# Patient Record
Sex: Female | Born: 1970 | Race: White | Hispanic: No | Marital: Single | Smoking: Current every day smoker
Health system: Southern US, Community
[De-identification: ages and names within clinical notes are randomized; demographics above are authoritative.]

## PROBLEM LIST (undated history)

## (undated) DIAGNOSIS — F603 Borderline personality disorder: Secondary | ICD-10-CM

## (undated) DIAGNOSIS — E781 Pure hyperglyceridemia: Secondary | ICD-10-CM

## (undated) DIAGNOSIS — F419 Anxiety disorder, unspecified: Secondary | ICD-10-CM

## (undated) DIAGNOSIS — F329 Major depressive disorder, single episode, unspecified: Secondary | ICD-10-CM

## (undated) HISTORY — PX: DILATION AND CURETTAGE OF UTERUS: SHX78

## (undated) HISTORY — PX: TONSILLECTOMY: SUR1361

---

## 2002-04-07 DIAGNOSIS — X828XXA Other intentional self-harm by crashing of motor vehicle, initial encounter: Secondary | ICD-10-CM | POA: Insufficient documentation

## 2010-12-26 DIAGNOSIS — IMO0002 Reserved for concepts with insufficient information to code with codable children: Secondary | ICD-10-CM | POA: Insufficient documentation

## 2010-12-26 DIAGNOSIS — R87810 Cervical high risk human papillomavirus (HPV) DNA test positive: Secondary | ICD-10-CM | POA: Insufficient documentation

## 2012-02-26 DIAGNOSIS — N393 Stress incontinence (female) (male): Secondary | ICD-10-CM | POA: Insufficient documentation

## 2013-10-31 DIAGNOSIS — F1020 Alcohol dependence, uncomplicated: Secondary | ICD-10-CM | POA: Insufficient documentation

## 2013-10-31 DIAGNOSIS — Z91199 Patient's noncompliance with other medical treatment and regimen due to unspecified reason: Secondary | ICD-10-CM | POA: Insufficient documentation

## 2015-12-29 DIAGNOSIS — F1420 Cocaine dependence, uncomplicated: Secondary | ICD-10-CM | POA: Insufficient documentation

## 2016-05-17 DIAGNOSIS — Z221 Carrier of other intestinal infectious diseases: Secondary | ICD-10-CM | POA: Insufficient documentation

## 2016-10-02 DIAGNOSIS — F4310 Post-traumatic stress disorder, unspecified: Secondary | ICD-10-CM | POA: Insufficient documentation

## 2016-10-21 DIAGNOSIS — F603 Borderline personality disorder: Secondary | ICD-10-CM | POA: Diagnosis present

## 2017-01-01 DIAGNOSIS — I341 Nonrheumatic mitral (valve) prolapse: Secondary | ICD-10-CM | POA: Insufficient documentation

## 2017-01-01 DIAGNOSIS — M5136 Other intervertebral disc degeneration, lumbar region: Secondary | ICD-10-CM | POA: Insufficient documentation

## 2017-01-01 DIAGNOSIS — E669 Obesity, unspecified: Secondary | ICD-10-CM | POA: Insufficient documentation

## 2017-01-01 DIAGNOSIS — F17200 Nicotine dependence, unspecified, uncomplicated: Secondary | ICD-10-CM | POA: Insufficient documentation

## 2017-02-04 DIAGNOSIS — M2550 Pain in unspecified joint: Secondary | ICD-10-CM | POA: Insufficient documentation

## 2017-02-04 DIAGNOSIS — M353 Polymyalgia rheumatica: Secondary | ICD-10-CM | POA: Insufficient documentation

## 2017-04-01 DIAGNOSIS — J310 Chronic rhinitis: Secondary | ICD-10-CM | POA: Insufficient documentation

## 2017-04-01 DIAGNOSIS — R053 Chronic cough: Secondary | ICD-10-CM | POA: Insufficient documentation

## 2017-07-12 DIAGNOSIS — F1220 Cannabis dependence, uncomplicated: Secondary | ICD-10-CM | POA: Insufficient documentation

## 2018-04-24 DIAGNOSIS — E559 Vitamin D deficiency, unspecified: Secondary | ICD-10-CM | POA: Insufficient documentation

## 2019-03-08 DIAGNOSIS — R45851 Suicidal ideations: Secondary | ICD-10-CM | POA: Insufficient documentation

## 2019-03-08 DIAGNOSIS — D735 Infarction of spleen: Secondary | ICD-10-CM | POA: Insufficient documentation

## 2019-03-09 DIAGNOSIS — F341 Dysthymic disorder: Secondary | ICD-10-CM | POA: Insufficient documentation

## 2019-07-06 DIAGNOSIS — R768 Other specified abnormal immunological findings in serum: Secondary | ICD-10-CM | POA: Insufficient documentation

## 2020-07-13 ENCOUNTER — Emergency Department (HOSPITAL_COMMUNITY)
Admission: EM | Admit: 2020-07-13 | Discharge: 2020-07-14 | Disposition: A | Discharge status: Home / Self Care | Payer: Medicare Other | Attending: Emergency Medicine | Admitting: Emergency Medicine

## 2020-07-13 ENCOUNTER — Encounter (HOSPITAL_COMMUNITY): Payer: Self-pay

## 2020-07-13 DIAGNOSIS — R45851 Suicidal ideations: Secondary | ICD-10-CM | POA: Diagnosis not present

## 2020-07-13 DIAGNOSIS — H9190 Unspecified hearing loss, unspecified ear: Secondary | ICD-10-CM

## 2020-07-13 DIAGNOSIS — F17210 Nicotine dependence, cigarettes, uncomplicated: Secondary | ICD-10-CM | POA: Diagnosis not present

## 2020-07-13 DIAGNOSIS — Z20822 Contact with and (suspected) exposure to covid-19: Secondary | ICD-10-CM | POA: Diagnosis not present

## 2020-07-13 DIAGNOSIS — F332 Major depressive disorder, recurrent severe without psychotic features: Secondary | ICD-10-CM | POA: Insufficient documentation

## 2020-07-13 DIAGNOSIS — F603 Borderline personality disorder: Secondary | ICD-10-CM | POA: Insufficient documentation

## 2020-07-13 HISTORY — DX: Major depressive disorder, single episode, unspecified: F32.9

## 2020-07-13 HISTORY — DX: Anxiety disorder, unspecified: F41.9

## 2020-07-13 HISTORY — DX: Borderline personality disorder: F60.3

## 2020-07-13 HISTORY — DX: Unspecified hearing loss, unspecified ear: H91.90

## 2020-07-13 LAB — COMPREHENSIVE METABOLIC PANEL
ALT: 14 U/L (ref 0–44)
AST: 16 U/L (ref 15–41)
Albumin: 4 g/dL (ref 3.5–5.0)
Alkaline Phosphatase: 97 U/L (ref 38–126)
Anion gap: 7 (ref 5–15)
BUN: 12 mg/dL (ref 6–20)
CO2: 25 mmol/L (ref 22–32)
Calcium: 9.4 mg/dL (ref 8.9–10.3)
Chloride: 106 mmol/L (ref 98–111)
Creatinine, Ser: 0.92 mg/dL (ref 0.44–1.00)
GFR, Estimated: >60 mL/min (ref >60)
Glucose, Bld: 110 mg/dL — ABNORMAL HIGH (ref 70–99)
Potassium: 4.4 mmol/L (ref 3.5–5.1)
Sodium: 138 mmol/L (ref 135–145)
Total Bilirubin: 0.2 mg/dL — ABNORMAL LOW (ref 0.3–1.2)
Total Protein: 6.8 g/dL (ref 6.5–8.1)

## 2020-07-13 LAB — CBC WITH DIFFERENTIAL/PLATELET
Abs Immature Granulocytes: 0.03 10*3/uL (ref 0.00–0.07)
Basophils Absolute: 0 10*3/uL (ref 0.0–0.1)
Basophils Relative: 1 %
Eosinophils Absolute: 0.1 10*3/uL (ref 0.0–0.5)
Eosinophils Relative: 1 %
HCT: 34.9 % — ABNORMAL LOW (ref 36.0–46.0)
Hemoglobin: 11.2 g/dL — ABNORMAL LOW (ref 12.0–15.0)
Immature Granulocytes: 0 %
Lymphocytes Relative: 25 %
Lymphs Abs: 1.7 10*3/uL (ref 0.7–4.0)
MCH: 29.8 pg (ref 26.0–34.0)
MCHC: 32.1 g/dL (ref 30.0–36.0)
MCV: 92.8 fL (ref 80.0–100.0)
Monocytes Absolute: 0.4 10*3/uL (ref 0.1–1.0)
Monocytes Relative: 6 %
Neutro Abs: 4.5 10*3/uL (ref 1.7–7.7)
Neutrophils Relative %: 67 %
Platelets: 431 10*3/uL — ABNORMAL HIGH (ref 150–400)
RBC: 3.76 MIL/uL — ABNORMAL LOW (ref 3.87–5.11)
RDW: 13.2 % (ref 11.5–15.5)
WBC: 6.7 10*3/uL (ref 4.0–10.5)
nRBC: 0 % (ref 0.0–0.2)

## 2020-07-13 LAB — RESP PANEL BY RT-PCR (FLU A&B, COVID) ARPGX2
Influenza A by PCR: NEGATIVE
Influenza B by PCR: NEGATIVE
SARS Coronavirus 2 by RT PCR: NEGATIVE

## 2020-07-13 LAB — RAPID URINE DRUG SCREEN, HOSP PERFORMED
Amphetamines: NOT DETECTED
Barbiturates: NOT DETECTED
Benzodiazepines: NOT DETECTED
Cocaine: NOT DETECTED
Opiates: NOT DETECTED
Tetrahydrocannabinol: NOT DETECTED

## 2020-07-13 LAB — ETHANOL: Alcohol, Ethyl (B): <10 mg/dL (ref <10)

## 2020-07-13 NOTE — ED Notes (Signed)
Ambulated to BR and back to bed with RN.

## 2020-07-13 NOTE — ED Provider Notes (Signed)
[facility] EMERGENCY DEPARTMENT Provider Note   CSN: [id number] Arrival date & time: 07/13/20  1031     History No chief complaint on file.   Anna [name] is a 50 y.o. female.  HPI Patient with history of major depression.  Presents from [facility] being suicidal.  Reportedly has a plan to hang herself.  States she has been struggling with this for years.  States she stopped taking her Trintellix because it was not working.  Stopped a couple weeks ago.  States she has been dealing with some medical issues that also has been making her feel worse.  Has a history of vaginal bleeding postmenopausally.  Had recent endometrial biopsy that did not show cancer.  States she been feeling bad about that.  Denies substance abuse.    Past Medical History:  Diagnosis Date  . Anxiety   . Borderline personality disorder in adult [facility])   . Hearing deficit 07/13/2020  . MDD (major depressive disorder)     There are no problems to display for this patient.   History reviewed. No pertinent surgical history.   OB History   No obstetric history on file.     No family history on file.  Social History   Tobacco Use  . Smoking status: Current Some Day Smoker    Packs/day: 0.20    Years: 40.00    Pack years: 8.00    Types: Cigarettes  . Smokeless tobacco: Never Used  Vaping Use  . Vaping Use: Every day  . Start date: 04/07/2020  . Substances: Nicotine, Flavoring  Substance Use Topics  . Alcohol use: Not Currently  . Drug use: Not Currently    Types: Cocaine, Marijuana    Home Medications Prior to Admission medications   Not on File    Allergies    Patient has no allergy information on record.  Review of Systems   Review of Systems  Constitutional: Negative for appetite change.  HENT: Negative for congestion.   Respiratory: Negative for shortness of breath.   Cardiovascular: Negative for chest pain.  Gastrointestinal: Negative for abdominal pain.   Genitourinary: Positive for vaginal bleeding.  Musculoskeletal: Negative for back pain.  Skin: Negative for rash.  Neurological: Negative for weakness.  Psychiatric/Behavioral: Positive for suicidal ideas. Negative for decreased concentration.    Physical Exam Updated Vital Signs BP 131/83 (BP Location: Left Arm)   Pulse 78   Temp 98.2 F (36.8 C) (Oral)   Resp 16   SpO2 100%   Physical Exam Vitals and nursing note reviewed.  HENT:     Head: Atraumatic.     Mouth/Throat:     Pharynx: Oropharynx is clear.  Cardiovascular:     Rate and Rhythm: Regular rhythm.  Pulmonary:     Breath sounds: No wheezing or rhonchi.  Abdominal:     Tenderness: There is no abdominal tenderness.  Musculoskeletal:        General: No tenderness.     Cervical back: Neck supple.  Skin:    General: Skin is warm.     Capillary Refill: Capillary refill takes less than 2 seconds.  Neurological:     Mental Status: She is alert and oriented to person, place, and time.  Psychiatric:     Comments: Somewhat depressed affect     ED Results / Procedures / Treatments   Labs (all labs ordered are listed, but only abnormal results are displayed) Labs Reviewed  COMPREHENSIVE METABOLIC PANEL - Abnormal; Notable for the following  components:      Result Value   Glucose, Bld 110 (*)    Total Bilirubin 0.2 (*)    All other components within normal limits  CBC WITH DIFFERENTIAL/PLATELET - Abnormal; Notable for the following components:   RBC 3.76 (*)    Hemoglobin 11.2 (*)    HCT 34.9 (*)    Platelets 431 (*)    All other components within normal limits  RESP PANEL BY RT-PCR (FLU A&B, COVID) ARPGX2  ETHANOL  RAPID URINE DRUG SCREEN, HOSP PERFORMED    EKG None  Radiology No results found.  Procedures Procedures   Medications Ordered in ED Medications - No data to display  ED Course  I have reviewed the triage vital signs and the nursing notes.  Pertinent labs & imaging results that were  available during my care of the patient were reviewed by me and considered in my medical decision making (see chart for details).    MDM Rules/Calculators/A&P                          Patient with suicidal ideations with plan.  Plan to hang herself.  States she is worried that she was going to do it.  Has previous suicide attempt.  Patient is medically cleared.  Will have patient seen by TTS. Final Clinical Impression(s) / ED Diagnoses Final diagnoses:  Suicidal ideations    Rx / DC Orders ED Discharge Orders    None       Benjiman Core, MD 07/13/20 1416

## 2020-07-13 NOTE — ED Notes (Signed)
Spoke with Charge nurse moved patient close to nurse station in visibility for staff to monitor.

## 2020-07-13 NOTE — ED Notes (Signed)
Patient resting on stretcher in direct line of vision to this RN. Patient with no distress noted. Staffing called for sitter when available. Room and environment cleared as much as possible of anything that patient could use to harm herself. Patient verbalized that she wants to hang herself.

## 2020-07-13 NOTE — BH Assessment (Addendum)
Comprehensive Clinical Assessment (CCA) Note  07/13/2020 Alan Riles [id number]   Disposition: Berneice Heinrich, FNP recommends in patient treatment. Patient is a high suicide risk and a 1:1 sitter is recommended.  The patient demonstrates the following risk factors for suicide: Chronic risk factors for suicide include: psychiatric disorder of depression, previous suicide attempts 3, chronic pain and completed suicide in a family member. Acute risk factors for suicide include: family or marital conflict and social withdrawal/isolation. Protective factors for this patient include: none. Considering these factors, the overall suicide risk at this point appears to be high. Patient is not appropriate for outpatient follow up.  Flowsheet Row ED from 07/13/2020 in [facility] EMERGENCY DEPARTMENT  C-SSRS RISK CATEGORY High Risk     Patient is a 50 year old female presenting voluntarily to [facility] ED reporting suicidal ideation with a plan to hang herself. Patient reports she went to [facility] in [place] and they "dropped her off" at the ED because they felt she needed a behavioral health hospital. Patient reports she began taking Trintellix 1 year ago but did not feel any symptom relief so discontinued it 4 days ago. Since then patient has become increasingly depressed, has poor motivation, and struggles to complete ADLs. 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 She is currently living in a recovery home but states the owner of the property is changing it to transitional housing and she may need to relocate. She denies HI/AVH. Patient reports she does not have any natural supports as her 6 children, 2 siblings, and mother are all estranged. She states this is a major contributing factor to her suicidal ideation.   Chief Complaint: Suicidal  Visit Diagnosis: F33.2 MDD, recurrent, severe    F60.3 BPD (per history)     CCA  Biopsychosocial Intake/Chief Complaint:  NA  Current Symptoms/Problems: NA   Patient Reported Schizophrenia/Schizoaffective Diagnosis in Past: No   Strengths: NA  Preferences: NA  Abilities: NA   Type of Services Patient Feels are Needed: NA   Initial Clinical Notes/Concerns: NA   Mental Health Symptoms Depression:  Change in energy/activity; Difficulty Concentrating; Fatigue; Hopelessness; Increase/decrease in appetite; Irritability; Sleep (too much or little); Tearfulness; Weight gain/loss; Worthlessness   Duration of Depressive symptoms: Greater than two weeks   Mania:  None   Anxiety:   None   Psychosis:  None   Duration of Psychotic symptoms: No data recorded  Trauma:  None   Obsessions:  None   Compulsions:  None   Inattention:  None   Hyperactivity/Impulsivity:  N/A   Oppositional/Defiant Behaviors:  N/A   Emotional Irregularity:  Chronic feelings of emptiness; Intense/unstable relationships; Recurrent suicidal behaviors/gestures/threats   Other Mood/Personality Symptoms:  No data recorded   Mental Status Exam Appearance and self-care  Stature:  Average   Weight:  Overweight   Clothing:  Neat/clean   Grooming:  Normal   Cosmetic use:  None   Posture/gait:  Slumped   Motor activity:  Not Remarkable   Sensorium  Attention:  Normal   Concentration:  Normal   Orientation:  X5   Recall/memory:  Normal   Affect and Mood  Affect:  Tearful; Depressed   Mood:  Depressed   Relating  Eye contact:  Normal   Facial expression:  Depressed   Attitude toward examiner:  Cooperative   Thought and Language  Speech flow: Clear and Coherent   Thought content:  Appropriate to Mood and Circumstances  Preoccupation:  None   Hallucinations:  None   Organization:  No data recorded  [employer] of Knowledge:  Good   Intelligence:  Average   Abstraction:  Normal   Judgement:  Impaired   Reality Testing:  Realistic    Insight:  Lacking   Decision Making:  Paralyzed   Social Functioning  Social Maturity:  Impulsive   Social Judgement:  Normal   Stress  Stressors:  Housing; Family conflict; Relationship   Coping Ability:  Exhausted   Skill Deficits:  Activities of daily living; Decision making; Interpersonal; Self-care   Supports:  Support needed     Religion: Religion/Spirituality Are You A Religious Person?: No  Leisure/Recreation: Leisure / Recreation Do You Have Hobbies?: No  Exercise/Diet: Exercise/Diet Do You Exercise?: No Have You Gained or Lost A Significant Amount of Weight in the Past Six Months?: No Do You Follow a Special Diet?: No Do You Have Any Trouble Sleeping?: Yes Explanation of Sleeping Difficulties: reports lays in bed all day and night but cannot sleep   CCA Employment/Education Employment/Work Situation: Employment / Work Situation Employment situation: On disability Why is patient on disability: NA How long has patient been on disability: NA Patient's job has been impacted by current illness: No What is the longest time patient has a held a job?: [employer] Where was the patient employed at that time?: [employer] Has patient ever been in the [employer]?: No  Education: Education Is Patient Currently Attending School?: No Last Grade Completed: 12 Name of High School: NA Did Garment/textile technologist From [employer]?: Yes Did Theme park manager?: No Did You Attend Graduate School?: No Did You Have An Individualized Education Program (IIEP): No Did You Have Any Difficulty At School?: No Patient's Education Has Been Impacted by Current Illness: No   CCA Family/Childhood History Family and Relationship History: Family history Marital status: Single Are you sexually active?: Yes What is your sexual orientation?: heterosexual Has your sexual activity been affected by drugs, alcohol, medication, or emotional stress?: NA Does patient have children?: Yes How many children?:  6 How is patient's relationship with their children?: all estranged except for 1  Childhood History:  Childhood History By whom was/is the patient raised?: Both parents Additional childhood history information: moved to [place] from [facility] at 74 Description of patient's relationship with caregiver when they were a child: close, supportive Patient's description of current relationship with people who raised him/her: relationship with mother is strained due a relationship she was in How were you disciplined when you got in trouble as a child/adolescent?: NA Does patient have siblings?: Yes Number of Siblings: 2 Description of patient's current relationship with siblings: sister is estranged, brother in [place], no contact Did patient suffer any verbal/emotional/physical/sexual abuse as a child?: No Did patient suffer from severe childhood neglect?: No Has patient ever been sexually abused/assaulted/raped as an adolescent or adult?: No Was the patient ever a victim of a crime or a disaster?: No Witnessed domestic violence?: No Has patient been affected by domestic violence as an adult?: No  Child/Adolescent Assessment:     CCA Substance Use Alcohol/Drug Use: Alcohol / Drug Use Pain Medications: see MAR Prescriptions: see MAR Over the Counter: see MAR History of alcohol / drug use?: Yes Longest period of sobriety (when/how long): Patient clean and sober since December 2021                         ASAM's:  Six Dimensions  of Multidimensional Assessment  Dimension 1:  Acute Intoxication and/or Withdrawal Potential:      Dimension 2:  Biomedical Conditions and Complications:      Dimension 3:  Emotional, Behavioral, or Cognitive Conditions and Complications:     Dimension 4:  Readiness to Change:     Dimension 5:  Relapse, Continued use, or Continued Problem Potential:     Dimension 6:  Recovery/Living Environment:     ASAM Severity Score:    ASAM Recommended Level of  Treatment:     Substance use Disorder (SUD)    Recommendations for Services/Supports/Treatments:    DSM5 Diagnoses: There are no problems to display for this patient.   Patient Centered Plan: Patient is on the following Treatment Plan(s):   Referrals to Alternative Service(s): Referred to Alternative Service(s):   Place:   Date:   Time:    Referred to Alternative Service(s):   Place:   Date:   Time:    Referred to Alternative Service(s):   Place:   Date:   Time:    Referred to Alternative Service(s):   Place:   Date:   Time:     Celedonio Miyamoto, LCSW

## 2020-07-13 NOTE — ED Triage Notes (Signed)
Pt sent over to the Ed by [facility] for [facility] thoughts with a plan hx of same

## 2020-07-14 NOTE — Progress Notes (Signed)
Per Berneice Heinrich, FNP, patient meets criteria for inpatient treatment. There are no appropriate beds at [facility] today. CSW faxed referrals to the following facilities for review:  [facility] [place] [facility]  TTS will continue to seek bed placement.  Crissie Reese, MSW, LCSW-A, LCAS-A Phone: [phone] Disposition/TOC

## 2020-07-14 NOTE — Progress Notes (Signed)
Per Lamar Laundry, RN, pt has been accepted to [facility], room 822 A. Accepting provider is Dr. Angela Burke. Patient can arrive anytime. Number for report is [phone]. Patient's nurse, Jamey Ripa, RN,  has been notified.   Crissie Reese, MSW, LCSW-A Phone: [phone] Disposition/TOC

## 2020-10-22 DIAGNOSIS — F17200 Nicotine dependence, unspecified, uncomplicated: Secondary | ICD-10-CM | POA: Diagnosis present

## 2021-01-29 DIAGNOSIS — F411 Generalized anxiety disorder: Secondary | ICD-10-CM | POA: Diagnosis present

## 2021-02-08 ENCOUNTER — Other Ambulatory Visit: Payer: Self-pay | Admitting: Psychiatry

## 2021-02-08 ENCOUNTER — Emergency Department (HOSPITAL_COMMUNITY): Payer: Medicare Other

## 2021-02-08 ENCOUNTER — Inpatient Hospital Stay (HOSPITAL_COMMUNITY)
Admission: RE | Admit: 2021-02-08 | Discharge: 2021-02-26 | DRG: 885 | Disposition: A | Discharge status: Other Acute Inpatient Hospital | Payer: Medicare Other | Source: Intra-hospital | Attending: Emergency Medicine | Admitting: Emergency Medicine

## 2021-02-08 ENCOUNTER — Emergency Department (HOSPITAL_COMMUNITY)
Admission: EM | Admit: 2021-02-08 | Discharge: 2021-02-08 | Disposition: A | Discharge status: Other Acute Inpatient Hospital | Payer: Medicare Other | Source: Home / Self Care | Attending: Emergency Medicine | Admitting: Emergency Medicine

## 2021-02-08 DIAGNOSIS — F17210 Nicotine dependence, cigarettes, uncomplicated: Secondary | ICD-10-CM | POA: Diagnosis present

## 2021-02-08 DIAGNOSIS — F1020 Alcohol dependence, uncomplicated: Secondary | ICD-10-CM | POA: Diagnosis present

## 2021-02-08 DIAGNOSIS — F603 Borderline personality disorder: Secondary | ICD-10-CM | POA: Diagnosis present

## 2021-02-08 DIAGNOSIS — Z9151 Personal history of suicidal behavior: Secondary | ICD-10-CM

## 2021-02-08 DIAGNOSIS — Z79899 Other long term (current) drug therapy: Secondary | ICD-10-CM

## 2021-02-08 DIAGNOSIS — Z888 Allergy status to other drugs, medicaments and biological substances status: Secondary | ICD-10-CM | POA: Diagnosis not present

## 2021-02-08 DIAGNOSIS — F411 Generalized anxiety disorder: Secondary | ICD-10-CM | POA: Diagnosis present

## 2021-02-08 DIAGNOSIS — F4310 Post-traumatic stress disorder, unspecified: Secondary | ICD-10-CM | POA: Diagnosis present

## 2021-02-08 DIAGNOSIS — Z9114 Patient's other noncompliance with medication regimen: Secondary | ICD-10-CM | POA: Diagnosis not present

## 2021-02-08 DIAGNOSIS — R451 Restlessness and agitation: Secondary | ICD-10-CM | POA: Diagnosis present

## 2021-02-08 DIAGNOSIS — F332 Major depressive disorder, recurrent severe without psychotic features: Secondary | ICD-10-CM | POA: Diagnosis not present

## 2021-02-08 DIAGNOSIS — R4182 Altered mental status, unspecified: Secondary | ICD-10-CM | POA: Insufficient documentation

## 2021-02-08 DIAGNOSIS — Z20822 Contact with and (suspected) exposure to covid-19: Secondary | ICD-10-CM | POA: Diagnosis present

## 2021-02-08 DIAGNOSIS — R000 Tachycardia, unspecified: Secondary | ICD-10-CM | POA: Insufficient documentation

## 2021-02-08 DIAGNOSIS — R45851 Suicidal ideations: Secondary | ICD-10-CM | POA: Diagnosis present

## 2021-02-08 DIAGNOSIS — Z9152 Personal history of nonsuicidal self-harm: Secondary | ICD-10-CM

## 2021-02-08 DIAGNOSIS — F419 Anxiety disorder, unspecified: Secondary | ICD-10-CM | POA: Insufficient documentation

## 2021-02-08 DIAGNOSIS — Z5181 Encounter for therapeutic drug level monitoring: Secondary | ICD-10-CM | POA: Diagnosis not present

## 2021-02-08 DIAGNOSIS — F17200 Nicotine dependence, unspecified, uncomplicated: Secondary | ICD-10-CM | POA: Diagnosis present

## 2021-02-08 DIAGNOSIS — F489 Nonpsychotic mental disorder, unspecified: Secondary | ICD-10-CM

## 2021-02-08 DIAGNOSIS — Z5902 Unsheltered homelessness: Secondary | ICD-10-CM

## 2021-02-08 DIAGNOSIS — R079 Chest pain, unspecified: Secondary | ICD-10-CM | POA: Diagnosis present

## 2021-02-08 DIAGNOSIS — G4700 Insomnia, unspecified: Secondary | ICD-10-CM | POA: Diagnosis present

## 2021-02-08 DIAGNOSIS — F333 Major depressive disorder, recurrent, severe with psychotic symptoms: Principal | ICD-10-CM | POA: Diagnosis present

## 2021-02-08 DIAGNOSIS — Y900 Blood alcohol level of less than 20 mg/100 ml: Secondary | ICD-10-CM | POA: Insufficient documentation

## 2021-02-08 LAB — CBC WITH DIFFERENTIAL/PLATELET
Abs Immature Granulocytes: 0.03 10*3/uL (ref 0.00–0.07)
Basophils Absolute: 0 10*3/uL (ref 0.0–0.1)
Basophils Relative: 0 %
Eosinophils Absolute: 0 10*3/uL (ref 0.0–0.5)
Eosinophils Relative: 0 %
HCT: 46.1 % — ABNORMAL HIGH (ref 36.0–46.0)
Hemoglobin: 15.2 g/dL — ABNORMAL HIGH (ref 12.0–15.0)
Immature Granulocytes: 0 %
Lymphocytes Relative: 13 %
Lymphs Abs: 1.4 10*3/uL (ref 0.7–4.0)
MCH: 27.9 pg (ref 26.0–34.0)
MCHC: 33 g/dL (ref 30.0–36.0)
MCV: 84.7 fL (ref 80.0–100.0)
Monocytes Absolute: 0.8 10*3/uL (ref 0.1–1.0)
Monocytes Relative: 7 %
Neutro Abs: 8.4 10*3/uL — ABNORMAL HIGH (ref 1.7–7.7)
Neutrophils Relative %: 80 %
Platelets: 321 10*3/uL (ref 150–400)
RBC: 5.44 MIL/uL — ABNORMAL HIGH (ref 3.87–5.11)
RDW: 14.4 % (ref 11.5–15.5)
WBC: 10.7 10*3/uL — ABNORMAL HIGH (ref 4.0–10.5)
nRBC: 0 % (ref 0.0–0.2)

## 2021-02-08 LAB — RAPID URINE DRUG SCREEN, HOSP PERFORMED
Amphetamines: NOT DETECTED
Barbiturates: NOT DETECTED
Benzodiazepines: POSITIVE — AB
Cocaine: NOT DETECTED
Opiates: NOT DETECTED
Tetrahydrocannabinol: NOT DETECTED

## 2021-02-08 LAB — COMPREHENSIVE METABOLIC PANEL
ALT: 17 U/L (ref 0–44)
AST: 22 U/L (ref 15–41)
Albumin: 4.5 g/dL (ref 3.5–5.0)
Alkaline Phosphatase: 130 U/L — ABNORMAL HIGH (ref 38–126)
Anion gap: 11 (ref 5–15)
BUN: 14 mg/dL (ref 6–20)
CO2: 22 mmol/L (ref 22–32)
Calcium: 9.5 mg/dL (ref 8.9–10.3)
Chloride: 104 mmol/L (ref 98–111)
Creatinine, Ser: 0.63 mg/dL (ref 0.44–1.00)
GFR, Estimated: >60 mL/min (ref >60)
Glucose, Bld: 114 mg/dL — ABNORMAL HIGH (ref 70–99)
Potassium: 3.7 mmol/L (ref 3.5–5.1)
Sodium: 137 mmol/L (ref 135–145)
Total Bilirubin: 0.9 mg/dL (ref 0.3–1.2)
Total Protein: 7.6 g/dL (ref 6.5–8.1)

## 2021-02-08 LAB — LIPID PANEL
Cholesterol: 259 mg/dL — ABNORMAL HIGH (ref 0–200)
HDL: 50 mg/dL (ref >40)
LDL Cholesterol: 187 mg/dL — ABNORMAL HIGH (ref 0–99)
Total CHOL/HDL Ratio: 5.2 RATIO
Triglycerides: 110 mg/dL (ref <150)
VLDL: 22 mg/dL (ref 0–40)

## 2021-02-08 LAB — URINALYSIS, ROUTINE W REFLEX MICROSCOPIC
Bacteria, UA: NONE SEEN
Bilirubin Urine: NEGATIVE
Glucose, UA: NEGATIVE mg/dL
Ketones, ur: 80 mg/dL — AB
Leukocytes,Ua: NEGATIVE
Nitrite: NEGATIVE
Protein, ur: 30 mg/dL — AB
Specific Gravity, Urine: 1.028 (ref 1.005–1.030)
pH: 5 (ref 5.0–8.0)

## 2021-02-08 LAB — RESP PANEL BY RT-PCR (FLU A&B, COVID) ARPGX2
Influenza A by PCR: NEGATIVE
Influenza B by PCR: NEGATIVE
SARS Coronavirus 2 by RT PCR: NEGATIVE

## 2021-02-08 LAB — TROPONIN I (HIGH SENSITIVITY): Troponin I (High Sensitivity): 4 ng/L (ref <18)

## 2021-02-08 LAB — HEMOGLOBIN A1C
Hgb A1c MFr Bld: 5.7 % — ABNORMAL HIGH (ref 4.8–5.6)
Mean Plasma Glucose: 116.89 mg/dL

## 2021-02-08 LAB — ACETAMINOPHEN LEVEL: Acetaminophen (Tylenol), Serum: <10 ug/mL — ABNORMAL LOW (ref 10–30)

## 2021-02-08 LAB — TSH: TSH: 1.514 u[IU]/mL (ref 0.350–4.500)

## 2021-02-08 LAB — ETHANOL: Alcohol, Ethyl (B): <10 mg/dL (ref <10)

## 2021-02-08 LAB — SALICYLATE LEVEL: Salicylate Lvl: <7 mg/dL — ABNORMAL LOW (ref 7.0–30.0)

## 2021-02-08 MED ORDER — OLANZAPINE 5 MG PO TBDP: 5.0000 mg | ORAL_TABLET | Freq: Three times a day (TID) | ORAL | Status: DC | PRN

## 2021-02-08 MED ORDER — ZIPRASIDONE MESYLATE 20 MG IM SOLR: 20.0000 mg | INTRAMUSCULAR | Status: DC | PRN

## 2021-02-08 MED ORDER — LORAZEPAM 2 MG/ML IJ SOLN
2.0000 mg | Freq: Once | INTRAMUSCULAR | Status: AC
  Administered 2021-02-08: 2 mg via INTRAVENOUS
  Filled 2021-02-08: qty 1

## 2021-02-08 MED ORDER — HYDROXYZINE HCL 25 MG PO TABS
25.0000 mg | ORAL_TABLET | Freq: Four times a day (QID) | ORAL | Status: DC | PRN
  Administered 2021-02-08: 25 mg via ORAL
  Filled 2021-02-08: qty 1

## 2021-02-08 MED ORDER — SODIUM CHLORIDE 0.9 % IV BOLUS
1000.0000 mL | Freq: Once | INTRAVENOUS | Status: AC
  Administered 2021-02-08: 1000 mL via INTRAVENOUS

## 2021-02-08 MED ORDER — LURASIDONE HCL 20 MG PO TABS
20.0000 mg | ORAL_TABLET | Freq: Every day | ORAL | Status: DC
  Administered 2021-02-08: 20 mg via ORAL
  Filled 2021-02-08: qty 1

## 2021-02-08 MED ORDER — HYDROXYZINE PAMOATE 25 MG PO CAPS: 26.0000 mg | ORAL_CAPSULE | Freq: Four times a day (QID) | ORAL | Status: DC | PRN

## 2021-02-08 MED ORDER — LORAZEPAM 1 MG PO TABS
1.0000 mg | ORAL_TABLET | ORAL | Status: AC | PRN
  Administered 2021-02-08: 1 mg via ORAL
  Filled 2021-02-08: qty 1

## 2021-02-08 NOTE — ED Notes (Signed)
Spoke with Misha, AC, no available sitters at this time.

## 2021-02-08 NOTE — ED Notes (Signed)
Pt agreed to allow RN to attempt IV start. IV was started successfully on 1st attempt and RN was able to draw most of the blood needed for lab work. Pt also says she will agree to have her xray performed. Pt declines ativan but agrees to receive normal saline as ordered.

## 2021-02-08 NOTE — ED Notes (Signed)
Patient becoming increasingly agitated stating "Tell those people around the corner to stop talking about me." Patient began to cry saying no one believes her. 1mg  of ativan, and 25 mg of hydroxyzine was given.
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
## 2021-02-08 NOTE — BH Assessment (Signed)
@  1417, called the [facility] nursing admin. @[phone]/ option #1; phone rings , no answer.

## 2021-02-08 NOTE — BH Assessment (Signed)
@  1400, requested via secure chat, patient's nurse (I-Li, RN) to place the TTS machine in patient's room.
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
## 2021-02-08 NOTE — BH Assessment (Signed)
@  1420, called nursing admin.and spoke to Clydie Braun, she will let patient's nurse know the TTS cart needs to be set up for patient.

## 2021-02-08 NOTE — ED Provider Notes (Signed)
Patient signed out to me at 7 AM.  Pending remaining medical clearance.  Head CT has been ordered including basic labs and viral testing.  Chest x-ray showed may be bronchitis but patient has no fever.  Head CT is unremarkable.  Negative for COVID and flu.  She is withdrawn on exam.  Will not answer if she is taking any alcohol or drugs.  She denies any infectious symptoms.  Has no major leukocytosis.  Possible that she has had some sort of recent viral process but overall no need for any antibiotics at this time.  At this time she is medically cleared and under IVC.  Awaiting for final psychiatric recommendations.  This chart was dictated using voice recognition software.  Despite best efforts to proofread,  errors can occur which can change the documentation meaning.    Virgina Norfolk, DO 02/08/21 1146

## 2021-02-08 NOTE — ED Notes (Signed)
Spoke to charge nurse to request a Recruitment consultant but per house supervisor, none are available until 0700.

## 2021-02-08 NOTE — ED Triage Notes (Signed)
BIB EMS from [facility] after staff called to report chest pain. Pt denies CP on arrival and says that she was having RUQ abdominal pain which has since resolved. On arrival she is extremely anxious and agitated with significant processing delay during triage interview. Tachycardia noted. Flight of ideas noted and pt is difficult to redirect.

## 2021-02-08 NOTE — ED Provider Notes (Signed)
[facility]EM[facility]GENCY [facility]PT Provider Note   CSN: [id number] Arrival date & time: 02/08/21  0029     History Chief Complaint  Patient presents with   Anxiety    Anna [name] is a 50 y.o. female.  The history is provided by the patient and medical records.  Anxiety Anna [name] is a 50 y.o. female who presents to the Emergency Department complaining of psychiatric evaluation. She presents the emergency department upon referral from behavioral health urgent care for anxiety. Level V caveat due to psychiatric illness. Patient is unable to state why she is in the emergency department. She states that she can't get her thoughts straight. She does not answer most questions on review of systems.     Past Medical History:  Diagnosis Date   Anxiety    Borderline personality disorder in adult [facility])    Hearing deficit 07/13/2020   MDD (major depressive disorder)     There are no problems to display for this patient.   No past surgical history on file.   OB History   No obstetric history on file.     No family history on file.  Social History   Tobacco Use   Smoking status: Some Days    Packs/day: 0.20    Years: 40.00    Pack years: 8.00    Types: Cigarettes   Smokeless tobacco: Never  Vaping Use   Vaping Use: Every day   Start date: 04/07/2020   Substances: Nicotine, Flavoring  Substance Use Topics   Alcohol use: Not Currently   Drug use: Not Currently    Types: Cocaine, Marijuana    Home Medications Prior to Admission medications   Medication Sig Start Date End Date Taking? Authorizing Provider  ARIPiprazole (ABILIFY) 10 MG tablet Take 10 mg by mouth every morning. 06/27/20   [provider]  hydrOXYzine (VISTARIL) 25 MG capsule Take 26 mg by mouth every 6 (six) hours as needed for anxiety. 06/06/19   [provider]  lamoTRIgine (LAMICTAL) 100 MG tablet Take 50 mg by mouth daily. 05/25/20   [provider]   medroxyPROGESTERone (PROVERA) 10 MG tablet Take 20 mg by mouth 2 (two) times daily. 07/02/20   [provider]  TRINTELLIX 20 MG TABS tablet Take 20 mg by mouth daily. 06/18/20   [provider]    Allergies    Trazodone  Review of Systems   Review of Systems  All other systems reviewed and are negative.  Physical Exam Updated Vital Signs BP (!) 151/98 (BP Location: Left Arm)   Pulse (!) 120   Temp 98.7 F (37.1 C) (Oral)   Resp (!) 24   Ht 5\' 9"  (1.753 m)   Wt 88.5 kg   SpO2 99%   BMI 28.80 kg/m   Physical Exam Vitals and nursing note reviewed.  Constitutional:      Appearance: She is well-developed.  HENT:     Head: Normocephalic and atraumatic.  Cardiovascular:     Rate and Rhythm: Regular rhythm. Tachycardia present.     Heart sounds: No murmur heard. Pulmonary:     Effort: Pulmonary effort is normal. No respiratory distress.     Breath sounds: Normal breath sounds.  Abdominal:     Palpations: Abdomen is soft.     Tenderness: There is no abdominal tenderness. There is no guarding or rebound.  Musculoskeletal:        General: No tenderness.  Skin:    General: Skin is warm and  dry.  Neurological:     Mental Status: She is alert and oriented to person, place, and time.     Comments: Moves all extremities symmetrically.  Psychiatric:     Comments: Anxious appearing. Slow to answer questions.    ED Results / Procedures / Treatments   Labs (all labs ordered are listed, but only abnormal results are displayed) Labs Reviewed - No data to display  EKG None  Radiology No results found.  Procedures Procedures   Medications Ordered in ED Medications - No data to display  ED Course  I have reviewed the triage vital signs and the nursing notes.  Pertinent labs & imaging results that were available during my care of the patient were reviewed by me and considered in my medical decision making (see chart for details).    MDM  Rules/Calculators/A&P                          patient referred to the emergency department from behavioral health urgent care for altered mental status. Patient is extremely anxious on examination and does not answer most questions on review of systems. She is initially refusing labs in the emergency department. Concern for her safety is there is reports that she is not eating or drinking. Patient refuses to answer questions regarding suicidality. Given concern for self harm IVC was completed. Patient care transferred pending labs for medical clearance followed by psychiatry evaluation.  Final Clinical Impression(s) / ED Diagnoses Final diagnoses:  None    Rx / DC Orders ED Discharge Orders     None        Tilden Fossa, MD 02/08/21 [phone]

## 2021-02-08 NOTE — Progress Notes (Addendum)
Pt was accepted to [facility] today 02/08/21. Bed assignment 407-2.  Pt meets inpatient criteria per Maxie Barb, NP,  Attending Physician will be  MD Haze Rushing  Report can be called to:  -Adult unit: [phone]  Pt can arrive after 0800pm  Care Team notified via secure chat: Melene Muller, RN, Camillo Flaming, RN, [facility] AC Malva Limes, and Maxie Barb, NP.   Kelton Pillar, LCSWA 02/08/2021 @ 5:55 PM

## 2021-02-08 NOTE — ED Notes (Signed)
Patient is being transferred to [facility] by police escort. Patient is under IVC

## 2021-02-08 NOTE — ED Notes (Signed)
Patient dressed out. Patients belongings in cabinets 5-8.

## 2021-02-08 NOTE — ED Notes (Signed)
Pt is refusing IV, lab work, and medications. Provider notified.

## 2021-02-08 NOTE — ED Notes (Signed)
Pt refused xray 

## 2021-02-08 NOTE — ED Notes (Signed)
Patient now resting comfortably. 

## 2021-02-09 ENCOUNTER — Encounter (HOSPITAL_COMMUNITY): Payer: Self-pay | Admitting: Psychiatry

## 2021-02-09 ENCOUNTER — Other Ambulatory Visit: Payer: Self-pay

## 2021-02-09 DIAGNOSIS — F333 Major depressive disorder, recurrent, severe with psychotic symptoms: Secondary | ICD-10-CM | POA: Diagnosis present

## 2021-02-09 MED ORDER — MELATONIN 5 MG PO TABS
5.0000 mg | ORAL_TABLET | Freq: Every day | ORAL | Status: DC
  Administered 2021-02-09 – 2021-02-25 (×16): 5 mg via ORAL
  Filled 2021-02-09 (×22): qty 1

## 2021-02-09 MED ORDER — BUSPIRONE HCL 7.5 MG PO TABS
7.5000 mg | ORAL_TABLET | Freq: Two times a day (BID) | ORAL | Status: DC
  Administered 2021-02-09 – 2021-02-11 (×4): 7.5 mg via ORAL
  Filled 2021-02-09 (×9): qty 1

## 2021-02-09 MED ORDER — LORAZEPAM 1 MG PO TABS
1.0000 mg | ORAL_TABLET | ORAL | Status: AC | PRN
  Administered 2021-02-09: 1 mg via ORAL
  Filled 2021-02-09: qty 1

## 2021-02-09 MED ORDER — ARIPIPRAZOLE 10 MG PO TABS
10.0000 mg | ORAL_TABLET | Freq: Every day | ORAL | Status: DC
  Administered 2021-02-09 – 2021-02-11 (×3): 10 mg via ORAL
  Filled 2021-02-09 (×7): qty 1

## 2021-02-09 MED ORDER — HYDROXYZINE HCL 25 MG PO TABS
25.0000 mg | ORAL_TABLET | Freq: Three times a day (TID) | ORAL | Status: DC | PRN
  Administered 2021-02-11 – 2021-02-25 (×12): 25 mg via ORAL
  Filled 2021-02-09 (×12): qty 1

## 2021-02-09 MED ORDER — ZIPRASIDONE MESYLATE 20 MG IM SOLR
20.0000 mg | Freq: Two times a day (BID) | INTRAMUSCULAR | Status: DC | PRN
  Administered 2021-02-09 – 2021-02-10 (×2): 20 mg via INTRAMUSCULAR
  Filled 2021-02-09 (×2): qty 20

## 2021-02-09 MED ORDER — BUPROPION HCL ER (XL) 150 MG PO TB24
150.0000 mg | ORAL_TABLET | Freq: Every day | ORAL | Status: DC
  Filled 2021-02-09 (×3): qty 1

## 2021-02-09 MED ORDER — MIRTAZAPINE 15 MG PO TABS
15.0000 mg | ORAL_TABLET | Freq: Every day | ORAL | Status: DC
  Administered 2021-02-09 – 2021-02-10 (×2): 15 mg via ORAL
  Filled 2021-02-09 (×4): qty 1

## 2021-02-09 NOTE — Plan of Care (Signed)
  Problem: Education: Goal: Emotional status will improve Outcome: Not Progressing Goal: Mental status will improve Outcome: Not Progressing Goal: Verbalization of understanding the information provided will improve Outcome: Not Progressing   

## 2021-02-09 NOTE — Progress Notes (Signed)
   02/09/21 0945  Psych Admission Type (Psych Patients Only)  Admission Status Involuntary  Psychosocial Assessment  Patient Complaints Suspiciousness;Worrying;Crying spells  Eye Contact Brief  Facial Expression Pained  Affect Frightened  Speech Pressured  Interaction Assertive;Needy  Motor Activity Other (Comment) (unremarkable)  Appearance/Hygiene Disheveled  Behavior Characteristics Anxious;Restless  Mood Anxious;Suspicious;Fearful  Thought Process  Coherency Tangential  Content Blaming self;Delusions  Delusions Persecutory;Paranoid  Perception WDL  Hallucination None reported or observed  Judgment Poor  Confusion Moderate  Danger to Self  Current suicidal ideation? Denies  Danger to Others  Danger to Others None reported or observed

## 2021-02-09 NOTE — Progress Notes (Signed)
Pt kept to herself. Pt guarded and avoidant when asked questions, pt vague with her answers. When asked was she in pain"I don't know", when asked if she has AVH "I don't know" pt irritable toward staff. Staff member asked her name and pt responded"ya'll know my name" and walked away from staff    02/09/21 2000  Psych Admission Type (Psych Patients Only)  Admission Status Involuntary  Psychosocial Assessment  Patient Complaints Worrying;Suspiciousness  Eye Contact Brief  Facial Expression Pained  Affect Frightened  Speech Pressured  Interaction Assertive;Needy  Motor Activity Other (Comment) (unremarkable)  Appearance/Hygiene Disheveled  Behavior Characteristics Guarded;Resistant to care  Mood Suspicious  Thought Process  Coherency Tangential  Content Blaming self;Delusions  Delusions Persecutory;Paranoid  Perception WDL  Hallucination None reported or observed  Judgment Poor  Confusion Moderate  Danger to Self  Current suicidal ideation? Denies  Danger to Others  Danger to Others None reported or observed

## 2021-02-09 NOTE — BHH Counselor (Signed)
CSW unable to complete PSA with this patient due to acuity.    Ruthann Cancer MSW, LCSW Clincal Social Worker  [facility]

## 2021-02-09 NOTE — Progress Notes (Signed)
Adult Psychoeducational Group Note  Date:  02/09/2021 Time:  11:06 PM  Group Topic/Focus:  Wrap-Up Group:   The focus of this group is to help patients review their daily goal of treatment and discuss progress on daily workbooks.  Pt did not attend.

## 2021-02-09 NOTE — BHH Suicide Risk Assessment (Signed)
[facility] Admission Suicide Risk Assessment   Nursing information obtained from:  Patient Demographic factors:  Caucasian, Unemployed Current Mental Status:  NA Loss Factors:  NA Historical Factors:  Prior suicide attempts Risk Reduction Factors:  NA  Total Time spent with patient: 20 minutes Principal Problem: Severe recurrent major depressive disorder with psychotic features (HCC) Diagnosis:  Principal Problem:   Severe recurrent major depressive disorder with psychotic features (HCC) Active Problems:   Alcohol use disorder, severe, dependence (HCC)   Borderline personality disorder (HCC)   Nicotine dependence with current use   GAD (generalized anxiety disorder)   PTSD (post-traumatic stress disorder)  Subjective Data: Anna [name] was approached for interview this morning and again after about an hour. She was drowsy but arousable. She stated that it "depends on who you ask" if she has hallucinations and refused to answer is she has suicidal/homicidal ideation. She states "I don't know" why she is here. This is after having a rather disruptive early morning. Her mood is "hopeless" when asked, but patient does not elaborate further.   Continued Clinical Symptoms:    The "Alcohol Use Disorders Identification Test", Guidelines for Use in Primary Care, Second Edition.  World Science writer [facility]). Score between 0-7:  no or low risk or alcohol related problems. Score between 8-15:  moderate risk of alcohol related problems. Score between 16-19:  high risk of alcohol related problems. Score 20 or above:  warrants further diagnostic evaluation for alcohol dependence and treatment.   CLINICAL FACTORS:   Depression:   Comorbid alcohol abuse/dependence Hopelessness Impulsivity Severe Alcohol/Substance Abuse/Dependencies Personality Disorders:   Cluster B Comorbid alcohol abuse/dependence Comorbid depression Chronic Pain More than one psychiatric diagnosis Previous Psychiatric Diagnoses  and Treatments Medical Diagnoses and Treatments/Surgeries   Musculoskeletal: Strength & Muscle Tone: within normal limits Gait & Station: unsteady Patient leans: N/A  Psychiatric Specialty Exam:  Presentation  General Appearance: Bizarre; Disheveled  Eye Contact:Poor  Speech:Clear and Coherent  Speech Volume:Normal  Handedness:No data recorded  Mood and Affect  Mood:Anxious; Labile; Depressed  Affect:Congruent; Tearful   Thought Process  Thought Processes:Disorganized  Descriptions of Associations:Loose  Orientation:Partial (Patient alert and oriented to her name, but not to time (day of the week, current month, current year), location, or situation.)  Thought Content:Paranoid Ideation; Scattered; Delusions  History of Schizophrenia/Schizoaffective disorder:No  Duration of Psychotic Symptoms:Greater than six months  Hallucinations:Hallucinations: -- (Patient endorses AVH, but does not answer further questions regarding details of her AVH.)  Ideas of Reference:Paranoia; Delusions  Suicidal Thoughts:Suicidal Thoughts: Yes, Active (Patient endorses active SI, but does not answer further questions regarding suicidal plan or intent.)  Homicidal Thoughts:Homicidal Thoughts: -- (Patient does not provide answers to questions about HI.)   Sensorium  Memory:-- (UTA at this time due to patient's current presentation.)  Judgment:Impaired  Insight:Lacking   Executive Functions  Concentration:Poor  Attention Span:Poor  Recall:-- (UTA at this time due to patient's current presentation.)  Fund of Knowledge:-- (UTA at this time due to patient's current presentation.)  Language:Fair   Psychomotor Activity  Psychomotor Activity:Psychomotor Activity: Restlessness   Assets  Assets:Desire for Improvement; Financial Resources/Insurance; Housing; Leisure Time; Physical Health; Resilience; Social Support   Sleep  Sleep:Sleep: Poor    Physical Exam: Physical  Exam Vitals and nursing note reviewed.  Constitutional:      Appearance: She is ill-appearing.  HENT:     Head: Normocephalic.  Psychiatric:        Mood and Affect: Mood is depressed. Affect is blunt and angry.  Speech: Speech is slurred.        Behavior: Behavior is uncooperative, slowed and withdrawn.        Thought Content: Thought content is paranoid.        Cognition and Memory: Cognition is impaired. Memory is impaired.        Judgment: Judgment is inappropriate.   Review of Systems  Unable to perform ROS: Mental acuity  Blood pressure 111/90, pulse (!) 106, temperature 97.7 F (36.5 C), temperature source Oral, resp. rate 18, height 5\' 8"  (1.727 m), weight 100.7 kg, SpO2 100 %. Body mass index is 33.75 kg/m.   COGNITIVE FEATURES THAT CONTRIBUTE TO RISK:  Closed-mindedness    SUICIDE RISK:   Moderate:  Frequent suicidal ideation with limited intensity, and duration, some specificity in terms of plans, no associated intent, good self-control, limited dysphoria/symptomatology, some risk factors present, and identifiable protective factors, including available and accessible social support.  PLAN OF CARE: Safety and Monitoring --  Admission to inpatient psychiatric unit for safety, stabilization and treatment -- Daily contact with patient to assess and evaluate symptoms and progress in treatment -- Patient's case to be discussed in multi-disciplinary team meeting. -- Patient will be encouraged to participate in the therapeutic group milieu. -- Observation Level : q15 minute checks -- Vital signs:  q12 hours -- Precautions: suicide.  Plan  -Monitor Vitals. -Monitor for thoughts of harm to self or others -Monitor for psychosis, disorganization or changes to cognition -Monitor for withdrawal symptoms. -Monitor for medication side effects.  Labs/Studies: See order  Medications: Resume previous; see orders    I certify that inpatient services furnished can  reasonably be expected to improve the patient's condition.   , MD 02/09/2021, 5:25 PM

## 2021-02-09 NOTE — Tx Team (Signed)
Initial Treatment Plan 02/09/2021 2:26 AM [name] TWK:[id number]    PATIENT STRESSORS: Marital or family conflict   Medication change or noncompliance     PATIENT STRENGTHS: General fund of knowledge  Motivation for treatment/growth    PATIENT IDENTIFIED PROBLEMS: Risk for SI  ETOH  Psychosis    "Y'all are not being honest with me"             DISCHARGE CRITERIA:  Improved stabilization in mood, thinking, and/or behavior Verbal commitment to aftercare and medication compliance  PRELIMINARY DISCHARGE PLAN: Attend aftercare/continuing care group Outpatient therapy  PATIENT/FAMILY INVOLVEMENT: This treatment plan has been presented to and reviewed with the patient, Anna [name].  The patient and family have been given the opportunity to ask questions and make suggestions.  Marcie Bal, RN 02/09/2021, 2:26 AM

## 2021-02-09 NOTE — BHH Group Notes (Signed)
Psychoeducational Group Note    Date:02/09/2021 Time: 1300-1400    Purpose of Group: . The group focus' on teaching patients on how to identify their needs and how Life Skills:  A group where two lists are made. What people need and what are things that we do that are healthy. The lists are developed by the patients and it is explained that we often do the actions that are not healthy to get our list of needs met.  to develop the coping skills needed to get their needs met  Participation Level:  Did not attend  Jandiel Magallanes A  
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
## 2021-02-09 NOTE — Progress Notes (Signed)
Pt observed yelling on the hallway.Screaming, "Why am I here? No one will tell me why I am here!" RN notified. Pt was redirected to the room until the treatment team meeting was complete.
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
## 2021-02-09 NOTE — Progress Notes (Signed)
Pt paranoid, pt argumentative "I'm always angry, you don't care about me"

## 2021-02-10 DIAGNOSIS — F333 Major depressive disorder, recurrent, severe with psychotic symptoms: Secondary | ICD-10-CM | POA: Diagnosis not present

## 2021-02-10 MED ORDER — LORAZEPAM 1 MG PO TABS
1.0000 mg | ORAL_TABLET | Freq: Once | ORAL | Status: AC
  Administered 2021-02-10: 1 mg via ORAL

## 2021-02-10 MED ORDER — LORAZEPAM 1 MG PO TABS
ORAL_TABLET | ORAL | Status: AC
  Filled 2021-02-10: qty 1

## 2021-02-10 NOTE — [facility] Counselor (Signed)
CSW attempted to meet with pt but she was sleeping. Due to agitation on the unit yesterday, it has been requested that pt not be disturbed at this time. CSW will make another attempt to complete PSA at another time.   Fredirick Lathe, LCSWA Clinicial Social Worker [employer]

## 2021-02-10 NOTE — Progress Notes (Signed)
   02/10/21 1000  Psych Admission Type (Psych Patients Only)  Admission Status Involuntary  Psychosocial Assessment  Patient Complaints Worrying;Sadness;Anxiety  Eye Contact Brief  Facial Expression Pained  Affect Frightened  Speech Pressured  Interaction Assertive;Needy  Motor Activity Other (Comment) (unremarkable)  Appearance/Hygiene Disheveled  Behavior Characteristics Anxious  Mood Sad;Suspicious;Irritable  Thought Process  Coherency Tangential  Content Blaming self;Delusions  Delusions Persecutory;Paranoid  Perception WDL  Hallucination None reported or observed  Judgment Poor  Confusion Moderate  Danger to Self  Current suicidal ideation? Denies  Danger to Others  Danger to Others None reported or observed

## 2021-02-10 NOTE — [facility] Group Notes (Signed)
Adult Psychoeducational Group Not Date:  02/10/2021 Time:  0900-1045 Group Topic/Focus: PROGRESSIVE RELAXATION. A group where deep breathing is taught and tensing and relaxation muscle groups is used. Imagery is used as well.  Pts are asked to imagine 3 pillars that hold them up when they are not able to hold themselves up.  Participation Level:  pt was invited to the group but did not attend Anna [name]

## 2021-02-10 NOTE — Group Note (Signed)
LCSW Group Therapy Note   Group Date: 02/10/2021 Start Time: 1000 End Time: 1030   Type of Therapy and Topic:  Group Therapy: Boundaries  Participation Level:  Active  Description of Group: This group will address the use of boundaries in their personal lives. Patients will explore why boundaries are important, the difference between healthy and unhealthy boundaries, and negative and postive outcomes of different boundaries and will look at how boundaries can be crossed.  Patients will be encouraged to identify current boundaries in their own lives and identify what kind of boundary is being set. Facilitators will guide patients in utilizing problem-solving interventions to address and correct types boundaries being used and to address when no boundary is being used. Understanding and applying boundaries will be explored and addressed for obtaining and maintaining a balanced life. Patients will be encouraged to explore ways to assertively make their boundaries and needs known to significant others in their lives, using other group members and facilitator for role play, support, and feedback.  Therapeutic Goals:  1.  Patient will identify areas in their life where setting clear boundaries could be  used to improve their life.  2.  Patient will identify signs/triggers that a boundary is not being respected. 3.  Patient will identify two ways to set boundaries in order to achieve balance in  their lives: 4.  Patient will demonstrate ability to communicate their needs and set boundaries  through discussion and/or role plays  Summary of Patient Progress: Due to the acuity and low staffing, group was not held on the adult unit today. Patients were provided therapeutic worksheets and asked to meet with CSW as needed.  Therapeutic Modalities:   Cognitive Behavioral Therapy Solution-Focused Therapy  Jeannette Maddy M Maycee Blasco, LCSWA 02/10/2021  10:25 AM    
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
## 2021-02-11 ENCOUNTER — Encounter (HOSPITAL_COMMUNITY): Payer: Self-pay

## 2021-02-11 DIAGNOSIS — F333 Major depressive disorder, recurrent, severe with psychotic symptoms: Secondary | ICD-10-CM | POA: Diagnosis not present

## 2021-02-11 LAB — LIPID PANEL
Cholesterol: 222 mg/dL — ABNORMAL HIGH (ref 0–200)
HDL: 41 mg/dL (ref >40)
LDL Cholesterol: 141 mg/dL — ABNORMAL HIGH (ref 0–99)
Total CHOL/HDL Ratio: 5.4 RATIO
Triglycerides: 200 mg/dL — ABNORMAL HIGH (ref <150)
VLDL: 40 mg/dL (ref 0–40)

## 2021-02-11 MED ORDER — OLANZAPINE 10 MG PO TBDP
10.0000 mg | ORAL_TABLET | Freq: Every day | ORAL | Status: DC
  Filled 2021-02-11 (×3): qty 1

## 2021-02-11 MED ORDER — LOPERAMIDE HCL 2 MG PO CAPS: 2.0000 mg | ORAL_CAPSULE | ORAL | Status: AC | PRN

## 2021-02-11 MED ORDER — ZIPRASIDONE MESYLATE 20 MG IM SOLR: 20.0000 mg | INTRAMUSCULAR | Status: DC | PRN

## 2021-02-11 MED ORDER — SENNA 8.6 MG PO TABS: 1.0000 | ORAL_TABLET | Freq: Every day | ORAL | Status: DC | PRN

## 2021-02-11 MED ORDER — IBUPROFEN 600 MG PO TABS
600.0000 mg | ORAL_TABLET | Freq: Four times a day (QID) | ORAL | Status: DC | PRN
  Administered 2021-02-11 – 2021-02-26 (×7): 600 mg via ORAL
  Filled 2021-02-11 (×7): qty 1

## 2021-02-11 MED ORDER — THIAMINE HCL 100 MG PO TABS
100.0000 mg | ORAL_TABLET | Freq: Every day | ORAL | Status: DC
  Administered 2021-02-12 – 2021-02-26 (×15): 100 mg via ORAL
  Filled 2021-02-11 (×18): qty 1

## 2021-02-11 MED ORDER — ACETAMINOPHEN 325 MG PO TABS
650.0000 mg | ORAL_TABLET | Freq: Four times a day (QID) | ORAL | Status: DC | PRN
  Administered 2021-02-19 – 2021-02-21 (×2): 650 mg via ORAL
  Filled 2021-02-11 (×3): qty 2

## 2021-02-11 MED ORDER — POLYETHYLENE GLYCOL 3350 17 G PO PACK: 17.0000 g | PACK | Freq: Every day | ORAL | Status: DC | PRN

## 2021-02-11 MED ORDER — OLANZAPINE 5 MG PO TBDP
5.0000 mg | ORAL_TABLET | Freq: Every day | ORAL | Status: DC
  Administered 2021-02-11 – 2021-02-12 (×2): 5 mg via ORAL
  Filled 2021-02-11 (×4): qty 1

## 2021-02-11 MED ORDER — LORAZEPAM 1 MG PO TABS
1.0000 mg | ORAL_TABLET | ORAL | Status: AC | PRN
  Administered 2021-02-26: 1 mg via ORAL
  Filled 2021-02-11 (×3): qty 1

## 2021-02-11 MED ORDER — ADULT MULTIVITAMIN W/MINERALS CH
1.0000 | ORAL_TABLET | Freq: Every day | ORAL | Status: DC
  Administered 2021-02-11 – 2021-02-26 (×15): 1 via ORAL
  Filled 2021-02-11 (×20): qty 1

## 2021-02-11 MED ORDER — ONDANSETRON 4 MG PO TBDP: 4.0000 mg | ORAL_TABLET | Freq: Four times a day (QID) | ORAL | Status: AC | PRN

## 2021-02-11 MED ORDER — OLANZAPINE 5 MG PO TBDP
5.0000 mg | ORAL_TABLET | Freq: Three times a day (TID) | ORAL | Status: DC | PRN
  Administered 2021-02-16 – 2021-02-23 (×3): 5 mg via ORAL
  Filled 2021-02-11 (×3): qty 1

## 2021-02-11 MED ORDER — LORAZEPAM 1 MG PO TABS
1.0000 mg | ORAL_TABLET | Freq: Four times a day (QID) | ORAL | Status: AC | PRN
  Administered 2021-02-12 – 2021-02-13 (×2): 1 mg via ORAL
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
## 2021-02-11 NOTE — BH IP Treatment Plan (Signed)
Interdisciplinary Treatment and Diagnostic Plan Update  02/11/2021 Time of Session: 10:40am Anna [name] MRN: [id number]  Principal Diagnosis: Severe recurrent major depressive disorder with psychotic features [facility])  Secondary Diagnoses: Principal Problem:   Severe recurrent major depressive disorder with psychotic features ([place]) Active Problems:   Alcohol use disorder, severe, dependence ([place])   Borderline personality disorder ([place])   Nicotine dependence with current use   GAD (generalized anxiety disorder)   PTSD (post-traumatic stress disorder)   Current Medications:  Current Facility-Administered Medications  Medication Dose Route Frequency Provider Last Rate Last Admin   ARIPiprazole (ABILIFY) tablet 10 mg  10 mg Oral Daily Onuoha, Josephine C, NP   10 mg at 02/11/21 0846   busPIRone (BUSPAR) tablet 7.5 mg  7.5 mg Oral BID Charmaine Downs C, NP   7.5 mg at 02/11/21 0846   hydrOXYzine (ATARAX/VISTARIL) tablet 25 mg  25 mg Oral TID PRN Charmaine Downs C, NP       melatonin tablet 5 mg  5 mg Oral QHS Onuoha, Josephine C, NP   5 mg at 02/10/21 2053   mirtazapine (REMERON) tablet 15 mg  15 mg Oral QHS Onuoha, Josephine C, NP   15 mg at 02/10/21 2052   ziprasidone (GEODON) injection 20 mg  20 mg Intramuscular Q12H PRN Lindell Spar I, NP   20 mg at 02/10/21 0818   PTA Medications: Medications Prior to Admission  Medication Sig Dispense Refill Last Dose   brexpiprazole (REXULTI) 1 MG TABS tablet Take by mouth daily. (Patient not taking: Reported on 02/09/2021)   Not Taking   buPROPion (WELLBUTRIN XL) 150 MG 24 hr tablet Take 150 mg by mouth daily. (Patient not taking: Reported on 02/09/2021)   Not Taking   busPIRone (BUSPAR) 10 MG tablet Take 10 mg by mouth 3 (three) times daily. (Patient not taking: Reported on 02/09/2021)   Not Taking   citalopram (CELEXA) 40 MG tablet Take 40 mg by mouth daily. (Patient not taking: Reported on 02/09/2021)   Not Taking   desvenlafaxine (PRISTIQ) 50  MG 24 hr tablet Take 50 mg by mouth daily. (Patient not taking: Reported on 02/09/2021)   Not Taking   hydrOXYzine (VISTARIL) 25 MG capsule Take 26 mg by mouth every 6 (six) hours as needed for anxiety. (Patient not taking: Reported on 02/09/2021)   Not Taking   LATUDA 20 MG TABS tablet Take 20 mg by mouth daily. (Patient not taking: Reported on 02/09/2021)   Not Taking   medroxyPROGESTERone (PROVERA) 10 MG tablet Take 20 mg by mouth 2 (two) times daily. (Patient not taking: Reported on 02/09/2021)   Not Taking   QUEtiapine (SEROQUEL) 200 MG tablet Take 200 mg by mouth at bedtime. (Patient not taking: Reported on 02/09/2021)   Not Taking   topiramate (TOPAMAX) 50 MG tablet Take 50 mg by mouth 2 (two) times daily. (Patient not taking: Reported on 02/09/2021)   Not Taking   traZODone (DESYREL) 150 MG tablet Take 150 mg by mouth at bedtime. (Patient not taking: Reported on 02/09/2021)   Not Taking   TRINTELLIX 20 MG TABS tablet Take 20 mg by mouth daily. (Patient not taking: Reported on 02/09/2021)   Not Taking    Patient Stressors: Marital or family conflict   Medication change or noncompliance    Patient Strengths: General fund of knowledge  Motivation for treatment/growth   Treatment Modalities: Medication Management, Group therapy, Case management,  1 to 1 session with clinician, Psychoeducation, Recreational therapy.   Physician Treatment Plan  for Primary Diagnosis: Severe recurrent major depressive disorder with psychotic features ([place]) Long Term Goal(s): Improvement in symptoms so as ready for discharge   Short Term Goals: Ability to identify changes in lifestyle to reduce recurrence of condition will improve Ability to verbalize feelings will improve Ability to disclose and discuss suicidal ideas Ability to demonstrate self-control will improve Ability to identify and develop effective coping behaviors will improve Ability to maintain clinical measurements within normal limits will  improve Compliance with prescribed medications will improve Ability to identify triggers associated with substance abuse/mental health issues will improve  Medication Management: Evaluate patient's response, side effects, and tolerance of medication regimen.  Therapeutic Interventions: 1 to 1 sessions, Unit Group sessions and Medication administration.  Evaluation of Outcomes: Not Met  Physician Treatment Plan for Secondary Diagnosis: Principal Problem:   Severe recurrent major depressive disorder with psychotic features ([place]) Active Problems:   Alcohol use disorder, severe, dependence ([place])   Borderline personality disorder ([place])   Nicotine dependence with current use   GAD (generalized anxiety disorder)   PTSD (post-traumatic stress disorder)  Long Term Goal(s): Improvement in symptoms so as ready for discharge   Short Term Goals: Ability to identify changes in lifestyle to reduce recurrence of condition will improve Ability to verbalize feelings will improve Ability to disclose and discuss suicidal ideas Ability to demonstrate self-control will improve Ability to identify and develop effective coping behaviors will improve Ability to maintain clinical measurements within normal limits will improve Compliance with prescribed medications will improve Ability to identify triggers associated with substance abuse/mental health issues will improve     Medication Management: Evaluate patient's response, side effects, and tolerance of medication regimen.  Therapeutic Interventions: 1 to 1 sessions, Unit Group sessions and Medication administration.  Evaluation of Outcomes: Not Met   RN Treatment Plan for Primary Diagnosis: Severe recurrent major depressive disorder with psychotic features ([place]) Long Term Goal(s): Knowledge of disease and therapeutic regimen to maintain health will improve  Short Term Goals: Ability to remain free from injury will improve, Ability to verbalize  frustration and anger appropriately will improve, Ability to identify and develop effective coping behaviors will improve, and Compliance with prescribed medications will improve  Medication Management: RN will administer medications as ordered by provider, will assess and evaluate patient's response and provide education to patient for prescribed medication. RN will report any adverse and/or side effects to prescribing provider.  Therapeutic Interventions: 1 on 1 counseling sessions, Psychoeducation, Medication administration, Evaluate responses to treatment, Monitor vital signs and CBGs as ordered, Perform/monitor CIWA, COWS, AIMS and Fall Risk screenings as ordered, Perform wound care treatments as ordered.  Evaluation of Outcomes: Not Met   LCSW Treatment Plan for Primary Diagnosis: Severe recurrent major depressive disorder with psychotic features ([place]) Long Term Goal(s): Safe transition to appropriate next level of care at discharge, Engage patient in therapeutic group addressing interpersonal concerns.  Short Term Goals: Engage patient in aftercare planning with referrals and resources, Increase social support, Increase ability to appropriately verbalize feelings, Identify triggers associated with mental health/substance abuse issues, and Increase skills for wellness and recovery  Therapeutic Interventions: Assess for all discharge needs, 1 to 1 time with Social worker, Explore available resources and support systems, Assess for adequacy in community support network, Educate family and significant other(s) on suicide prevention, Complete Psychosocial Assessment, Interpersonal group therapy.  Evaluation of Outcomes: Not Met   Progress in Treatment: Attending groups: No. Participating in groups: No. Taking medication as prescribed: No. Toleration medication:  No. Family/Significant other contact made: No, will contact:  if consent is provided Patient understands diagnosis:  No. Discussing patient identified problems/goals with staff: No. Medical problems stabilized or resolved: Yes. Denies suicidal/homicidal ideation: Yes. Issues/concerns per patient self-inventory: No.   New problem(s) identified: No, Describe:  none  New Short Term/Long Term Goal(s): medication stabilization, elimination of SI thoughts, development of comprehensive mental wellness plan.    Patient Goals:  "Try to get depressive symptoms under control"  Discharge Plan or Barriers: Patient recently admitted. CSW will continue to follow and assess for appropriate referrals and possible discharge planning.    Reason for Continuation of Hospitalization: Anxiety Depression Medication stabilization Suicidal ideation  Estimated Length of Stay: 3-5 days   Scribe for Treatment Team: Vassie Moselle, LCSW 02/11/2021 11:11 AM

## 2021-02-11 NOTE — Progress Notes (Signed)
Adult Psychoeducational Group Note  Date:  02/11/2021 Time:  8:27 PM  Group Topic/Focus:  Wrap-Up Group:   The focus of this group is to help patients review their daily goal of treatment and discuss progress on daily workbooks.  Participation Level:  Did Not Attend  Participation Quality:   Did Not Attend  Affect:   Did Not Attend  Cognitive:   Did Not Attend  Insight: None  Engagement in Group:   Did Not Attend  Modes of Intervention:   Did Not Attend  Additional Comments:  Pt did not attend evening wrap up group tonight.  Felipa Furnace 02/11/2021, 8:27 PM

## 2021-02-11 NOTE — Group Note (Signed)
Recreation Therapy Group Note   Group Topic:Coping Skills  Group Date: 02/11/2021 Start Time: 1000 End Time: 1030 Facilitators: Caroll Rancher, LRT/CTRS Location: [address] Dayroom   Goal Area(s) Addresses:  Patients will identify positive coping skills. Patients will identify benefit of using positive coping skills. Patients will identify positive coping skills to use post d/c.  Group Description: Mind Map.  Patients and LRT filled out the first 8 boxes of the mind map together with anger, mad, stress, gittery, hypomanic, thrifty and two others.  Patients were given 15 minutes to break off and identify at least three coping skills for each situation. The group would come back together and LRT will right coping skills on the board.       Affect/Mood: Tearful   Participation Level: None   Participation Quality: None   Behavior: Distracted and Guarded   Speech/Thought Process: Preoccupied   Insight: Lacking   Judgement: Lacking    Modes of Intervention: Worksheet   Patient Response to Interventions:  Challenging    Education Outcome:  Acknowledges education and In group clarification offered    Clinical Observations/Individualized Feedback: Pt was tearful and crying throughout group session.  Pt was unable to grab hold to what group was about.  Pt was preoccupied with other things that were making her upset and cry.    Plan: Continue to engage patient in RT group sessions 2-3x/week.   Caroll Rancher, LRT/CTRS 02/11/2021 1:33 PM

## 2021-02-11 NOTE — Group Note (Signed)
  Type of Therapy and Topic:  Group Therapy:  Healthy and Unhealthy Supports  Participation Level:  Minimal   Description of Group:  Patients in this group were introduced to the idea of adding a variety of healthy supports to address the various needs in their lives.Patients discussed what additional healthy supports could be helpful in their recovery and wellness after discharge in order to prevent future hospitalizations.   An emphasis was placed on using counselor, doctor, therapy groups, 12-step groups, and problem-specific support groups to expand supports.  They also worked as a group on developing a specific plan for several patients to deal with unhealthy supports through boundary-setting, psychoeducation with loved ones, and even termination of relationships.   Therapeutic Goals:   1)  discuss importance of adding supports to stay well once out of the hospital  2)  compare healthy versus unhealthy supports and identify some examples of each  3)  generate ideas and descriptions of healthy supports that can be added  4)  offer mutual support about how to address unhealthy supports  5)  encourage active participation in and adherence to discharge plan    Summary of Patient Progress:  The patient participated minimally in group. She stated that she had a 5 year relationship where she thought she had support and the person just abandoned her.  Patient left halfway through group and appeared upset at group topic.  She came back at the end of group but did not participate further.    Therapeutic Modalities:   Motivational Interviewing Brief Solution-Focused Therapy  Beatris Si, LCSW 02/11/2021  2:05 PM

## 2021-02-11 NOTE — B[facility] Counselor (Addendum)
CSW attempted assessment with Pt.  A partial assessment was completed.  CSW will attempt assessment again on 02/12/2021 and will attempt to get ROI's signed at that time.  Pt continues to be paranoid,  tearful and making delusional statements. Pt states that there is pain in her left leg and numbness in both her feet.  She also states that she has not had a bowl movement in more than 3 days.  CSW has informed the doctor and NP of these Pt concerns.

## 2021-02-11 NOTE — Progress Notes (Signed)
DAR NOTE: Patient presents with irritable affect and mood.  Reports passive SI.  Tearful, agitated and loud during assessment.  Staff unable to redirect patient but became louder when redirected.  Demanding and asking staff to help locate her kids.   Rates depression at 10, hopelessness at 10, and anxiety at 8.  Maintained on routine safety checks.  Medications given as prescribed.  Support and encouragement offered as needed.  Attended group and participated.  States goal for today is "to find out why I am here and how to get things better."  Patient observed socializing with peers in the dayroom.  Patient is safe on the unit.

## 2021-02-11 NOTE — Progress Notes (Signed)
   02/11/21 0200  Psych Admission Type (Psych Patients Only)  Admission Status Involuntary  Psychosocial Assessment  Patient Complaints Anxiety  Eye Contact Brief  Facial Expression Pained  Affect Frightened  Speech Pressured  Interaction Assertive;Needy  Motor Activity Other (Comment) (unremarkable)  Appearance/Hygiene Disheveled  Behavior Characteristics Anxious  Mood Suspicious;Labile  Thought Process  Coherency Tangential  Content Blaming self;Delusions  Delusions Persecutory;Paranoid  Perception WDL  Hallucination None reported or observed  Judgment Poor  Confusion Moderate  Danger to Self  Current suicidal ideation? Denies  Danger to Others  Danger to Others None reported or observed

## 2021-02-12 DIAGNOSIS — F333 Major depressive disorder, recurrent, severe with psychotic symptoms: Secondary | ICD-10-CM | POA: Diagnosis not present

## 2021-02-12 MED ORDER — SERTRALINE HCL 25 MG PO TABS
25.0000 mg | ORAL_TABLET | Freq: Every day | ORAL | Status: AC
  Administered 2021-02-12: 25 mg via ORAL
  Filled 2021-02-12 (×2): qty 1

## 2021-02-12 MED ORDER — OLANZAPINE 10 MG PO TBDP
10.0000 mg | ORAL_TABLET | Freq: Every day | ORAL | Status: DC
  Filled 2021-02-12 (×2): qty 1

## 2021-02-12 MED ORDER — OLANZAPINE 5 MG PO TBDP
5.0000 mg | ORAL_TABLET | Freq: Once | ORAL | Status: AC
  Administered 2021-02-12: 5 mg via ORAL
  Filled 2021-02-12: qty 1

## 2021-02-12 MED ORDER — OLANZAPINE 5 MG PO TBDP
5.0000 mg | ORAL_TABLET | Freq: Every day | ORAL | Status: DC
  Administered 2021-02-13: 5 mg via ORAL
  Filled 2021-02-12 (×3): qty 1

## 2021-02-12 MED ORDER — SERTRALINE HCL 50 MG PO TABS
50.0000 mg | ORAL_TABLET | Freq: Every day | ORAL | Status: DC
  Administered 2021-02-13 – 2021-02-14 (×2): 50 mg via ORAL
  Filled 2021-02-12 (×4): qty 1

## 2021-02-12 NOTE — Group Note (Signed)
Recreation Therapy Group Note   Group Topic:Communication  Group Date: 02/12/2021 Start Time: 0950 End Time: 1030 Facilitators: Caroll Rancher, LRT/CTRS Location: [address] Dayroom  Goal Area(s) Addresses:  Patient will effectively listen to complete activity.  Patient will identify communication skills used to make activity successful.  Patient will identify how skills used during activity can be used to reach post d/c goals.     Group Description: Geometric Drawings.  Three volunteers from the peer group will be shown an abstract picture with a particular arrangement of geometrical shapes.  Each round, one 'speaker' will describe the pattern, as accurately as possible without revealing the image to the group.  The remaining group members will listen and draw the picture to reflect how it is described to them. Patients with the role of 'listener' cannot ask clarifying questions but, may request that the speaker repeat a direction. Once the drawings are complete, the presenter will show the rest of the group the picture and compare how close each person came to drawing the picture. LRT will facilitate a post-activity discussion regarding effective communication and the importance of planning, listening, and asking for clarification in daily interactions with others.   Affect/Mood: Sad   Participation Level: None   Participation Quality: None   Behavior: Tearful   Speech/Thought Process: Unfocused   Insight: Lacking   Judgement: Lacking    Modes of Intervention: Art   Patient Response to Interventions:  Resistant    Education Outcome:  Acknowledges education and In group clarification offered    Clinical Observations/Individualized Feedback: Pt came to group for about 5-10 minutes, stated she couldn't do the activity then left.    Plan: Continue to engage patient in RT group sessions 2-3x/week.   Caroll Rancher, LRT/CTRS 02/12/2021 12:11 PM
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
## 2021-02-12 NOTE — BHH Group Notes (Signed)
Pt attended goals ground and contributed
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
## 2021-02-12 NOTE — BHH Suicide Risk Assessment (Signed)
BHH INPATIENT:  Family/Significant Other Suicide Prevention Education  Suicide Prevention Education:  Patient Refusal for Family/Significant Other Suicide Prevention Education: The patient Anna [name] has refused to provide written consent for family/significant other to be provided Family/Significant Other Suicide Prevention Education during admission and/or prior to discharge.  Physician notified.  Metro Kung Raenell Mensing 02/12/2021, 2:24 PM

## 2021-02-12 NOTE — BHH Group Notes (Signed)
Patient attended and contributed to relaxation group

## 2021-02-12 NOTE — Progress Notes (Signed)
Anna [name] reported her day was better because "I'm not hearing the voices right now."  She denied SI/HI.  She denied feeling paranoid but appears to be guarded.  She was calm and cooperative this evening.  Minimal interaction with peers.  She denied any pain or discomfort and appeared to be in no physical distress.  CIWA not completed at midnight as she was asleep.  Q 15 minute checks maintained for safety.   02/12/21 2107  Psych Admission Type (Psych Patients Only)  Admission Status Involuntary  Psychosocial Assessment  Patient Complaints Insomnia  Eye Contact Brief  Facial Expression Pained  Affect Sad  Speech Soft  Interaction Assertive;Needy  Motor Activity Other (Comment) (unremarkable)  Appearance/Hygiene Disheveled  Behavior Characteristics Anxious;Cooperative  Mood Depressed  Thought Process  Coherency Blocking;Disorganized  Content Blaming self;Delusions  Delusions Persecutory;Paranoid  Perception WDL  Hallucination None reported or observed  Judgment Poor  Confusion Moderate  Danger to Self  Current suicidal ideation? Denies  Danger to Others  Danger to Others None reported or observed

## 2021-02-13 DIAGNOSIS — F333 Major depressive disorder, recurrent, severe with psychotic symptoms: Secondary | ICD-10-CM | POA: Diagnosis not present

## 2021-02-13 LAB — CBC
HCT: 50.6 % — ABNORMAL HIGH (ref 36.0–46.0)
Hemoglobin: 16 g/dL — ABNORMAL HIGH (ref 12.0–15.0)
MCH: 27.6 pg (ref 26.0–34.0)
MCHC: 31.6 g/dL (ref 30.0–36.0)
MCV: 87.4 fL (ref 80.0–100.0)
Platelets: 340 10*3/uL (ref 150–400)
RBC: 5.79 MIL/uL — ABNORMAL HIGH (ref 3.87–5.11)
RDW: 13.9 % (ref 11.5–15.5)
WBC: 7.5 10*3/uL (ref 4.0–10.5)
nRBC: 0 % (ref 0.0–0.2)

## 2021-02-13 LAB — COMPREHENSIVE METABOLIC PANEL
ALT: 17 U/L (ref 0–44)
AST: 16 U/L (ref 15–41)
Albumin: 4.1 g/dL (ref 3.5–5.0)
Alkaline Phosphatase: 110 U/L (ref 38–126)
Anion gap: 9 (ref 5–15)
BUN: 10 mg/dL (ref 6–20)
CO2: 21 mmol/L — ABNORMAL LOW (ref 22–32)
Calcium: 9.4 mg/dL (ref 8.9–10.3)
Chloride: 107 mmol/L (ref 98–111)
Creatinine, Ser: 0.76 mg/dL (ref 0.44–1.00)
GFR, Estimated: >60 mL/min (ref >60)
Glucose, Bld: 126 mg/dL — ABNORMAL HIGH (ref 70–99)
Potassium: 4.7 mmol/L (ref 3.5–5.1)
Sodium: 137 mmol/L (ref 135–145)
Total Bilirubin: 0.4 mg/dL (ref 0.3–1.2)
Total Protein: 7.5 g/dL (ref 6.5–8.1)

## 2021-02-13 MED ORDER — OLANZAPINE 10 MG PO TBDP
10.0000 mg | ORAL_TABLET | Freq: Every day | ORAL | Status: DC
  Administered 2021-02-13: 10 mg via ORAL
  Filled 2021-02-13 (×4): qty 1

## 2021-02-13 MED ORDER — PRAZOSIN HCL 1 MG PO CAPS
1.0000 mg | ORAL_CAPSULE | Freq: Every day | ORAL | Status: DC
  Administered 2021-02-13 – 2021-02-24 (×10): 1 mg via ORAL
  Filled 2021-02-13 (×15): qty 1

## 2021-02-13 MED ORDER — OLANZAPINE 5 MG PO TBDP
5.0000 mg | ORAL_TABLET | Freq: Every day | ORAL | Status: DC
  Administered 2021-02-14: 5 mg via ORAL
  Filled 2021-02-13 (×3): qty 1

## 2021-02-13 NOTE — BHH Group Notes (Signed)
Topic: Grounding Techniques   Due to the acuity on the unit, group was not held. Patient was provided therapeutic worksheets and asked to meet with CSW as needed.          Ruthann Cancer MSW, LCSW Clincal Social Worker  [facility]

## 2021-02-13 NOTE — Progress Notes (Addendum)
   02/13/21 2110  Psych Admission Type (Psych Patients Only)  Admission Status Involuntary  Psychosocial Assessment  Patient Complaints Anxiety;Depression  Eye Contact Brief  Facial Expression Pained  Affect Sad  Speech Soft  Interaction Minimal  Motor Activity Other (Comment) (wnl)  Appearance/Hygiene Disheveled  Behavior Characteristics Cooperative;Anxious  Mood Anxious;Depressed  Thought Process  Coherency Circumstantial  Content WDL  Delusions None reported or observed  Perception WDL  Hallucination None reported or observed  Judgment Poor  Confusion UTA  Danger to Self  Current suicidal ideation? Passive  Self-Injurious Behavior No self-injurious ideation or behavior indicators observed or expressed   Agreement Not to Harm Self Yes  Description of Agreement verbal contract to approach staff  Danger to Others  Danger to Others None reported or observed   Pt seen at med window. Pt in room in bed. Went to ask pt to come ot med window. Pt denies HI, AVH and pain. Pt says she is not having SI at the moment. Pt contracts for safety. Pt very subdued. Pt rates anxiety 8/10 and depression 6/10. Pt says she is feeling a little better today. Pt takes nighttime meds as prescribed.

## 2021-02-13 NOTE — BHH Group Notes (Signed)
Adult Psychoeducational Group Note  Date:  02/13/2021 Time:  9:02 AM  Group Topic/Focus:  Goals Group:   The focus of this group is to help patients establish daily goals to achieve during treatment and discuss how the patient can incorporate goal setting into their daily lives to aide in recovery.  Participation Level:  Active  Participation Quality:  Appropriate  Affect:  Appropriate  Cognitive:  Appropriate  Insight: Appropriate  Engagement in Group:  Improving  Modes of Intervention:  Discussion  Additional Comments:  Pt Goal: Try to clear my mind, Go to groups  Donell Beers 02/13/2021, 9:02 AM
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
## 2021-02-13 NOTE — Progress Notes (Signed)
DAR NOTE: Patient presents with anxious affect and irritable mood.  Patient was tearful during assessment.  Reports passive SI and hearing voices.  Described energy level as low with poor concentration. Rates depression at 10, hopelessness at 10, and anxiety at 9.  Maintained on routine safety checks.  Medications given as prescribed.  Support and encouragement offered as needed.  Unable to attend group due to unit acuity and disruption.  States goal for today is "try to clear my mind and go to groups."  Patient visible in milieu briefly but had to leave due to peers being disruptive. Patient is safe on the unit.

## 2021-02-13 NOTE — Progress Notes (Signed)
Recreation Therapy Notes  Date: 11.9.22 Time: 1000-1025 Location: 500 Hall  Group Topic: Values Clarification   Goal Area(s) Addresses:  Patient will identify what brings them peace. Patient will identify the importance of having peace post d/c.  Behavioral Response: Depressed  Intervention: Worksheet  Activity: Finding Peace.  Patients were to identify their definitions of peace.  Patients were then to explain what the five steps of finding peace for them were.  LRT and patients went over the sheets together to get an understanding of how each person viewed the topic.  Education:  Values, Discharge Planning  Education Outcome: Acknowledges understanding/In group clarification offered/Needs additional education.   Clinical Observations/Feedback: Due to disruption/acuity on the, LRT did 1:1 worksheets with patients on finding peace.  Pt was tearful and depressed.  Pt was able to complete sheet.  Pt defined peace as "peace of mind, serenity and self soothing".  Pt stated the only positive thing she's done in her life was get her GED.  When asked what positive traits would make for a peaceful relationship, pt stated "No idea, no clue.  I wouldn't know if it hit me in the face.  If their weird, that what I'm attracted to".  Pt did express wanting to be able to help people like herself.  Pt was asked what nonmaterial item were she grateful for a patient stated "I'm pretty much hopeless".  LRT explained to pt she would have to find the strength within herself or something that motivates her to pull herself out feeling that way.  Pt went on to say "what it you can't get past being hopeless.  You've gone through all this and just still hopeless"?  LRT expressed no one is going to be able to make her feel better about herself or situation, she has to continue to do the work for herself.  Pt didn't seemed interested and just wanted stay in her pity party.  Lastly, patient was asked was there anything she  needed to set limits on to which she responded "everything".    Caroll Rancher, LRT,CTRS Caroll Rancher A 02/13/2021 12:31 PM

## 2021-02-13 NOTE — [facility] Group Notes (Signed)
Adult Psychoeducational Group Note  Date:  02/13/2021 Time:  4:33 PM  Group Topic/Focus:  Relaxation Group  Participation Level:  Active  Participation Quality:  Appropriate  Affect:  Appropriate  Cognitive:  Appropriate  Insight: Appropriate  Engagement in Group:  Engaged  Modes of Intervention:  Activity  Additional Comments:    Donell Beers 02/13/2021, 4:33 PM

## 2021-02-13 NOTE — B[facility] Counselor (Signed)
CSW spoke with the Pt who states that she is not sure where she will live after discharge.  She continues to be paranoid and believes that the staff and other patient's on the unit are laughing and talking about her.  She states "everyone here hates me and looks at me like I am a bad person".  She statesthat no residential treatment center or [facility] will accept her because "I have been to so many that they will not take me back anymore".  CSW repeatedly offered these resources but the Pt continues to refuse.  CSW will continue to speak with the Pt about these resources and discuss the possibility of each resource with the Pt.  CSW will also provide the Pt with a homeless packet with shelter and housing resources.

## 2021-02-13 NOTE — Final Progress Note (Signed)
Patient has stayed in bed but was able to get up for dinner and medications. Reported no concern.

## 2021-02-13 NOTE — [facility] Group Notes (Signed)
[facility] Group Notes:  (Nursing/MHT/Case Management/Adjunct)  Date:  02/13/2021  Time:  8:36 PM  Type of Therapy:  Psychoeducational Skills  Participation Level:  None  Participation Quality:  Resistant  Affect:  Resistant  Cognitive:  Lacking  Insight:  None  Engagement in Group:  None  Modes of Intervention:  Education  Summary of Progress/Problems: The patient did not attend group this evening.   Hazle Coca S 02/13/2021, 8:36 PM

## 2021-02-14 MED ORDER — SERTRALINE HCL 50 MG PO TABS
75.0000 mg | ORAL_TABLET | Freq: Every day | ORAL | Status: DC
  Administered 2021-02-15: 75 mg via ORAL
  Filled 2021-02-14 (×3): qty 1

## 2021-02-14 MED ORDER — OLANZAPINE 10 MG PO TBDP
10.0000 mg | ORAL_TABLET | Freq: Every day | ORAL | Status: DC
  Administered 2021-02-14: 10 mg via ORAL
  Filled 2021-02-14 (×3): qty 1

## 2021-02-14 MED ORDER — PROPRANOLOL HCL 10 MG PO TABS
10.0000 mg | ORAL_TABLET | Freq: Three times a day (TID) | ORAL | Status: DC
Start: 2021-02-14 — End: 2021-02-21
  Administered 2021-02-14 – 2021-02-21 (×17): 10 mg via ORAL
  Filled 2021-02-14 (×27): qty 1

## 2021-02-14 MED ORDER — OLANZAPINE 10 MG PO TBDP
10.0000 mg | ORAL_TABLET | Freq: Every day | ORAL | Status: DC
  Administered 2021-02-15: 10 mg via ORAL
  Filled 2021-02-14 (×3): qty 1

## 2021-02-14 NOTE — [facility] Group Notes (Signed)
Adult Psychoeducational Group Note  Date:  02/14/2021 Time:  9:36 AM  Group Topic/Focus:  Goals Group:   The focus of this group is to help patients establish daily goals to achieve during treatment and discuss how the patient can incorporate goal setting into their daily lives to aide in recovery.  Participation Level:  Active  Participation Quality:  Resistant  Affect:  Tearful  Cognitive:  Disorganized  Insight: Lacking  Engagement in Group:  Resistant  Modes of Intervention:  Discussion  Additional Comments:  Pt was tearful and confused  Donell Beers 02/14/2021, 9:36 AM

## 2021-02-14 NOTE — Group Note (Signed)
Recreation Therapy Group Note   Group Topic:Self-Esteem  Group Date: 02/14/2021 Start Time: 1000 End Time: 1045 Facilitators: Caroll Rancher, LRT/CTRS Location: 500 Hall Dayroom   Goal Area(s) Addresses:  Patient will identify and write positive traits about themselves. Patient will successfully identify all the influences in their lives.  Group Description:  LRT began group session with open dialogue asking the patients to define self-esteem and verbally identify positive qualities and traits people may possess. Patients were then instructed to design a personalized license plate, with words and drawings, representing at least 3 positive things about themselves. Pts were encouraged to include favorites, things they are proud of, what they enjoy doing, and dreams for their future. If a patient had a life motto or a meaningful phase that expressed their life values, pt's were asked to incorporate that into their design as well. Patients were given the opportunity to share their completed work with the group.   Affect/Mood: N/A   Participation Level: Did not attend    Clinical Observations/Individualized Feedback: Pt did not attend group.    Plan: Continue to engage patient in RT group sessions 2-3x/week.   Caroll Rancher, LRT/CTRS  02/14/2021 11:30 AM

## 2021-02-14 NOTE — [facility] Group Notes (Signed)
[facility] Group Notes:  (Nursing/MHT/Case Management/Adjunct)  Date:  02/14/2021  Time:  8:39 PM  Type of Therapy:   Wrap Up   Participation Level:  Active  Participation Quality:  Appropriate and Attentive  Affect:  Appropriate  Cognitive:  Appropriate  Insight:  Appropriate, Good, and Improving  Engagement in Group:  Developing/Improving  Modes of Intervention:  Discussion  Summary of Progress/Problems: Pt is naturally reserved but was active in group. Pt said she was glad she woke up this morning and that she is ready for discharge.  Lorita Officer 02/14/2021, 8:39 PM

## 2021-02-14 NOTE — Progress Notes (Signed)
[facility] MD Progress Note  02/14/2021 6:37 PM Anna [name]  MRN:  [id number] Subjective:    Patient is a 50 years old admitted from [facility] for MDD with Psychotic features with known psychiatric hx of MDD recurrent severe without psychosis, alcohol use disorder with dependence, borderline personality disorder, cannabis use disorder, cocaine use disorder, GAD, PTSD, and suicide attempt.  On evaluation today, the patient reports she is depressed, anxious, overwhelmed.  She reports "my thoughts are disorganized".  Patient is distressed to not be able to think clearly.  She reports target symptoms are depression, anxiety, not being able to think clearly.  She reports that she slept better last night, but is still having nightmares.  Denies changes in appetite.  Reports having passive suicidal thoughts.  Denies having homicidal thoughts Denies having auditory or visual hallucinations. Patient denies having side effects to current medications.  She received Zyprexa 10 mg nightly, last night, for the first time since hospital admission. We discussed increasing Zyprexa, and starting propranolol, for disorganized thinking and anxiety respectively.  Patient is agreeable to this plan.    Principal Problem: Severe recurrent major depressive disorder with psychotic features (HCC) Diagnosis: Principal Problem:   Severe recurrent major depressive disorder with psychotic features (HCC) Active Problems:   Borderline personality disorder (HCC)   Nicotine dependence with current use   GAD (generalized anxiety disorder)  Total Time spent with patient: 30 minutes  Past Psychiatric History: See H&P  Past Medical History:  Past Medical History:  Diagnosis Date   Anxiety    Borderline personality disorder in adult [facility])    Hearing deficit 07/13/2020   MDD (major depressive disorder)    History reviewed. No pertinent surgical history. Family History: History reviewed. No pertinent family history. Family  Psychiatric  History: See H&P Social History:  Social History   Substance and Sexual Activity  Alcohol Use Not Currently     Social History   Substance and Sexual Activity  Drug Use Not Currently   Types: Cocaine, Marijuana    Social History   Socioeconomic History   Marital status: Single    Spouse name: Not on file   Number of children: Not on file   Years of education: Not on file   Highest education level: Not on file  Occupational History   Not on file  Tobacco Use   Smoking status: Some Days    Packs/day: 0.20    Years: 40.00    Pack years: 8.00    Types: Cigarettes   Smokeless tobacco: Never  Vaping Use   Vaping Use: Every day   Start date: 04/07/2020   Substances: Nicotine, Flavoring  Substance and Sexual Activity   Alcohol use: Not Currently   Drug use: Not Currently    Types: Cocaine, Marijuana   Sexual activity: Not on file  Other Topics Concern   Not on file  Social History Narrative   Not on file   Social Determinants of Health   Financial Resource Strain: Not on file  Food Insecurity: Not on file  Transportation Needs: Not on file  Physical Activity: Not on file  Stress: Not on file  Social Connections: Not on file   Additional Social History:                         Sleep: Fair  Appetite:  Fair  Current Medications: Current Facility-Administered Medications  Medication Dose Route Frequency Provider Last Rate Last Admin   acetaminophen (  TYLENOL) tablet 650 mg  650 mg Oral Q6H PRN Laveda Abbe, NP       hydrOXYzine (ATARAX/VISTARIL) tablet 25 mg  25 mg Oral TID PRN Dahlia Byes C, NP   25 mg at 02/14/21 1005   ibuprofen (ADVIL) tablet 600 mg  600 mg Oral Q6H PRN Laveda Abbe, NP   600 mg at 02/14/21 1307   OLANZapine zydis (ZYPREXA) disintegrating tablet 5 mg  5 mg Oral Q8H PRN Laveda Abbe, NP       And   LORazepam (ATIVAN) tablet 1 mg  1 mg Oral PRN Laveda Abbe, NP       And    ziprasidone (GEODON) injection 20 mg  20 mg Intramuscular PRN Laveda Abbe, NP       melatonin tablet 5 mg  5 mg Oral QHS Dahlia Byes C, NP   5 mg at 02/13/21 2110   multivitamin with minerals tablet 1 tablet  1 tablet Oral Daily Kamariyah Timberlake, Harrold Donath, MD   1 tablet at 02/14/21 0816   OLANZapine zydis (ZYPREXA) disintegrating tablet 10 mg  10 mg Oral QHS Armandina Stammer I, NP   10 mg at 02/13/21 1835   And   OLANZapine zydis (ZYPREXA) disintegrating tablet 5 mg  5 mg Oral Daily Armandina Stammer I, NP   5 mg at 02/14/21 0816   polyethylene glycol (MIRALAX / GLYCOLAX) packet 17 g  17 g Oral Daily PRN Laveda Abbe, NP       prazosin (MINIPRESS) capsule 1 mg  1 mg Oral QHS Laveda Abbe, NP   1 mg at 02/13/21 2111   propranolol (INDERAL) tablet 10 mg  10 mg Oral TID Phineas Inches, MD   10 mg at 02/14/21 1702   senna (SENOKOT) tablet 8.6 mg  1 tablet Oral Daily PRN Laveda Abbe, NP       Melene Muller ON 02/15/2021] sertraline (ZOLOFT) tablet 75 mg  75 mg Oral Daily Efrata Brunner, Harrold Donath, MD       thiamine tablet 100 mg  100 mg Oral Daily Arwin Bisceglia, Harrold Donath, MD   100 mg at 02/14/21 0454    Lab Results:  Results for orders placed or performed during the hospital encounter of 02/08/21 (from the past 48 hour(s))  CBC     Status: Abnormal   Collection Time: 02/13/21  7:03 AM  Result Value Ref Range   WBC 7.5 4.0 - 10.5 K/uL   RBC 5.79 (H) 3.87 - 5.11 MIL/uL   Hemoglobin 16.0 (H) 12.0 - 15.0 g/dL   HCT 09.8 (H) 11.9 - 14.7 %   MCV 87.4 80.0 - 100.0 fL   MCH 27.6 26.0 - 34.0 pg   MCHC 31.6 30.0 - 36.0 g/dL   RDW 82.9 56.2 - 13.0 %   Platelets 340 150 - 400 K/uL   nRBC 0.0 0.0 - 0.2 %    Comment: Performed at [facility], [address] [place], [place] [address]  Comprehensive metabolic panel     Status: Abnormal   Collection Time: 02/13/21  7:03 AM  Result Value Ref Range   Sodium 137 135 - 145 mmol/L   Potassium 4.7 3.5 - 5.1 mmol/L   Chloride  107 98 - 111 mmol/L   CO2 21 (L) 22 - 32 mmol/L   Glucose, Bld 126 (H) 70 - 99 mg/dL    Comment: Glucose reference range applies only to samples taken after fasting for at least 8 hours.   BUN  10 6 - 20 mg/dL   Creatinine, Ser 5.57 0.44 - 1.00 mg/dL   Calcium 9.4 8.9 - 32.2 mg/dL   Total Protein 7.5 6.5 - 8.1 g/dL   Albumin 4.1 3.5 - 5.0 g/dL   AST 16 15 - 41 U/L   ALT 17 0 - 44 U/L   Alkaline Phosphatase 110 38 - 126 U/L   Total Bilirubin 0.4 0.3 - 1.2 mg/dL   GFR, Estimated >02 >54 mL/min    Comment: (NOTE) Calculated using the CKD-EPI Creatinine Equation (2021)    Anion gap 9 5 - 15    Comment: Performed at [facility], [address], [place], [place] [address]    Blood Alcohol level:  Lab Results  Component Value Date   [facility] <10 02/08/2021   ETH <10 07/13/2020    Metabolic Disorder Labs: Lab Results  Component Value Date   HGBA1C 5.7 (H) 02/08/2021   MPG 116.89 02/08/2021   No results found for: PROLACTIN Lab Results  Component Value Date   CHOL 222 (H) 02/11/2021   TRIG 200 (H) 02/11/2021   HDL 41 02/11/2021   CHOLHDL 5.4 02/11/2021   VLDL 40 02/11/2021   LDLCALC 141 (H) 02/11/2021   LDLCALC 187 (H) 02/08/2021    Physical Findings: AIMS:  , ,  ,  ,    CIWA:  CIWA-Ar Total: 0 COWS:     Musculoskeletal: Strength & Muscle Tone: within normal limits Gait & Station: normal Patient leans: N/A  Psychiatric Specialty Exam:  Presentation  General Appearance: Disheveled  Eye Contact:Fleeting  Speech:Normal Rate  Speech Volume:Decreased  Handedness:Right   Mood and Affect  Mood:Anxious; Depressed; Dysphoric; Hopeless; Labile; Worthless  Affect:Tearful; Labile   Thought Process  Thought Processes:Disorganized  Descriptions of Associations:Tangential  Orientation:Partial  Thought Content:Tangential  History of Schizophrenia/Schizoaffective disorder:No  Duration of Psychotic  Symptoms:N/A  Hallucinations:Hallucinations: None  Ideas of Reference:None  Suicidal Thoughts:Suicidal Thoughts: Yes, Passive SI Active Intent and/or Plan: Without Plan; Without Intent  Homicidal Thoughts:Homicidal Thoughts: No   Sensorium  Memory:Immediate Fair; Recent Fair; Remote Fair  Judgment:Poor  Insight:Poor   Executive Functions  Concentration:Poor  Attention Span:Poor  Recall:Poor  Fund of Knowledge:Poor  Language:Fair   Psychomotor Activity  Psychomotor Activity:Psychomotor Activity: Decreased   Assets  Assets:Desire for Improvement; Manufacturing systems engineer; Financial Resources/Insurance; Housing   Sleep  Sleep:Sleep: Good    Physical Exam: Physical Exam Constitutional:      General: She is not in acute distress.    Appearance: She is normal weight. She is not ill-appearing or toxic-appearing.  Pulmonary:     Effort: Pulmonary effort is normal. No respiratory distress.  Neurological:     Mental Status: She is alert.     Motor: No weakness.     Gait: Gait normal.   Review of Systems  Constitutional:  Negative for chills and fever.  Cardiovascular:  Negative for chest pain and palpitations.  Psychiatric/Behavioral:  Positive for depression and suicidal ideas. The patient is nervous/anxious.   All other systems reviewed and are negative. Blood pressure 134/83, pulse 83, temperature 97.9 F (36.6 C), temperature source Oral, resp. rate 18, height 5\' 8"  (1.727 m), weight 100.7 kg, SpO2 100 %. Body mass index is 33.75 kg/m.   Treatment Plan Summary: Daily contact with patient to assess and evaluate symptoms and progress in treatment and Medication management.   Continue inpatient hospitalization. Will continue today 02/13/2021 plan as below except where it is noted.    MDD, recurrent severe with  psychotic features:  Increase Zyprexa from 5 mg in the morning to 10 mg in the morning.   Continue Zyprexa 10 mg nightly Changed dosing time for 10  mg po at 2100. Continue Zoloft 50 mg PO daily starting 11/9.  Start propranolol 10 mg 3 times daily, for anxiety   Anxiety:   Continue Hydroxyzine 25 mg po tid as needed for anxiety   Insomnia:  -Continue Melatonin 5 mg po at bed time for sleep.    PTSD.  Continue Minipress 1 mg po Q hs.   Agitation protocol:  Geodon 20 mg IM as needed for agitation Ativan 1 mg PO as needed for anxiety Zyprexa Zydis 5 mg PO every 8 hours as needed for agitation.   Continue other prn medications as ordered for other medical complaints.   Continue every 15 Minute safety checks Encourage participation on the therapeutic milieu and group therapy  Discharge planning in progress  Total Time Spent in Direct Patient Care:  I personally spent 30 minutes on the unit in direct patient care. The direct patient care time included face-to-face time with the patient, reviewing the patient's chart, communicating with other professionals, and coordinating care. Greater than 50% of this time was spent in counseling or coordinating care with the patient regarding goals of hospitalization, psycho-education, and discharge planning needs.   Phineas Inches, MD Psychiatrist    Cristy Hilts, MD 02/14/2021, 6:37 PM

## 2021-02-14 NOTE — Progress Notes (Signed)
Anna [name] was more isolative to her room this evening.  She did attended evening wrap up group and snack but promptly returned to her room.  She reports passive SI and verbally agrees to not harm herself on the unit.  She denies HI or AVH.  She reports that she may be leaving tomorrow but she feels that she isn't quite ready.  Encouraged her to speak with MD tomorrow.  No prn's given at this time.  Q 15 minute checks maintained for safety.   02/14/21 2138  Psych Admission Type (Psych Patients Only)  Admission Status Involuntary  Psychosocial Assessment  Patient Complaints Irritability;Isolation  Eye Contact Brief  Facial Expression Pained  Affect Sad  Speech Soft  Interaction Minimal  Motor Activity Slow  Appearance/Hygiene Disheveled  Behavior Characteristics Cooperative;Anxious  Mood Anxious;Depressed  Thought Process  Coherency Circumstantial  Content WDL  Delusions None reported or observed  Perception WDL  Hallucination None reported or observed  Judgment Poor  Confusion None  Danger to Self  Current suicidal ideation? Passive  Self-Injurious Behavior No self-injurious ideation or behavior indicators observed or expressed   Agreement Not to Harm Self Yes  Description of Agreement verbal contract to approach staff  Danger to Others  Danger to Others None reported or observed

## 2021-02-15 ENCOUNTER — Encounter (HOSPITAL_COMMUNITY): Payer: Self-pay

## 2021-02-15 MED ORDER — SERTRALINE HCL 100 MG PO TABS
100.0000 mg | ORAL_TABLET | Freq: Every day | ORAL | Status: DC
  Administered 2021-02-16 – 2021-02-17 (×2): 100 mg via ORAL
  Filled 2021-02-15 (×4): qty 1

## 2021-02-15 MED ORDER — OLANZAPINE 5 MG PO TBDP
5.0000 mg | ORAL_TABLET | Freq: Every day | ORAL | Status: DC
  Filled 2021-02-15: qty 1

## 2021-02-15 MED ORDER — OLANZAPINE 5 MG PO TBDP
5.0000 mg | ORAL_TABLET | Freq: Every day | ORAL | Status: DC
  Administered 2021-02-16 – 2021-02-19 (×4): 5 mg via ORAL
  Filled 2021-02-15 (×6): qty 1

## 2021-02-15 MED ORDER — OLANZAPINE 15 MG PO TBDP
15.0000 mg | ORAL_TABLET | Freq: Every day | ORAL | Status: DC
  Administered 2021-02-15 – 2021-02-18 (×4): 15 mg via ORAL
  Filled 2021-02-15 (×6): qty 1

## 2021-02-15 MED ORDER — QUETIAPINE FUMARATE ER 50 MG PO TB24
100.0000 mg | ORAL_TABLET | Freq: Every day | ORAL | Status: DC
  Filled 2021-02-15: qty 2

## 2021-02-15 MED ORDER — QUETIAPINE FUMARATE ER 50 MG PO TB24: 50.0000 mg | ORAL_TABLET | Freq: Every day | ORAL | Status: DC

## 2021-02-15 NOTE — BH IP Treatment Plan (Signed)
Interdisciplinary Treatment and Diagnostic Plan Update  02/15/2021 Time of Session:  Meris Reede MRN: [id number]  Principal Diagnosis: Severe recurrent major depressive disorder with psychotic features [facility])  Secondary Diagnoses: Principal Problem:   Severe recurrent major depressive disorder with psychotic features (HCC) Active Problems:   Borderline personality disorder (HCC)   Nicotine dependence with current use   GAD (generalized anxiety disorder)   Current Medications:  Current Facility-Administered Medications  Medication Dose Route Frequency Provider Last Rate Last Admin   acetaminophen (TYLENOL) tablet 650 mg  650 mg Oral Q6H PRN Laveda Abbe, NP       hydrOXYzine (ATARAX/VISTARIL) tablet 25 mg  25 mg Oral TID PRN Dahlia Byes C, NP   25 mg at 02/14/21 1005   ibuprofen (ADVIL) tablet 600 mg  600 mg Oral Q6H PRN Laveda Abbe, NP   600 mg at 02/14/21 1307   OLANZapine zydis (ZYPREXA) disintegrating tablet 5 mg  5 mg Oral Q8H PRN Laveda Abbe, NP       And   LORazepam (ATIVAN) tablet 1 mg  1 mg Oral PRN Laveda Abbe, NP       And   ziprasidone (GEODON) injection 20 mg  20 mg Intramuscular PRN Laveda Abbe, NP       melatonin tablet 5 mg  5 mg Oral QHS Onuoha, Josephine C, NP   5 mg at 02/14/21 2138   multivitamin with minerals tablet 1 tablet  1 tablet Oral Daily Massengill, Harrold Donath, MD   1 tablet at 02/15/21 0833   OLANZapine zydis (ZYPREXA) disintegrating tablet 10 mg  10 mg Oral Daily Massengill, Harrold Donath, MD   10 mg at 02/15/21 9381   And   OLANZapine zydis (ZYPREXA) disintegrating tablet 10 mg  10 mg Oral QHS Massengill, Harrold Donath, MD   10 mg at 02/14/21 2139   polyethylene glycol (MIRALAX / GLYCOLAX) packet 17 g  17 g Oral Daily PRN Laveda Abbe, NP       prazosin (MINIPRESS) capsule 1 mg  1 mg Oral QHS Laveda Abbe, NP   1 mg at 02/14/21 2138   propranolol (INDERAL) tablet 10 mg  10 mg Oral TID Phineas Inches, MD   10 mg at 02/15/21 0835   senna (SENOKOT) tablet 8.6 mg  1 tablet Oral Daily PRN Laveda Abbe, NP       sertraline (ZOLOFT) tablet 75 mg  75 mg Oral Daily Massengill, Harrold Donath, MD   75 mg at 02/15/21 8299   thiamine tablet 100 mg  100 mg Oral Daily Massengill, Harrold Donath, MD   100 mg at 02/15/21 3716   PTA Medications: Medications Prior to Admission  Medication Sig Dispense Refill Last Dose   brexpiprazole (REXULTI) 1 MG TABS tablet Take by mouth daily. (Patient not taking: Reported on 02/09/2021)   Not Taking   buPROPion (WELLBUTRIN XL) 150 MG 24 hr tablet Take 150 mg by mouth daily. (Patient not taking: Reported on 02/09/2021)   Not Taking   busPIRone (BUSPAR) 10 MG tablet Take 10 mg by mouth 3 (three) times daily. (Patient not taking: Reported on 02/09/2021)   Not Taking   citalopram (CELEXA) 40 MG tablet Take 40 mg by mouth daily. (Patient not taking: Reported on 02/09/2021)   Not Taking   desvenlafaxine (PRISTIQ) 50 MG 24 hr tablet Take 50 mg by mouth daily. (Patient not taking: Reported on 02/09/2021)   Not Taking   hydrOXYzine (VISTARIL) 25 MG capsule Take 26 mg by mouth  every 6 (six) hours as needed for anxiety. (Patient not taking: Reported on 02/09/2021)   Not Taking   LATUDA 20 MG TABS tablet Take 20 mg by mouth daily. (Patient not taking: Reported on 02/09/2021)   Not Taking   medroxyPROGESTERone (PROVERA) 10 MG tablet Take 20 mg by mouth 2 (two) times daily. (Patient not taking: Reported on 02/09/2021)   Not Taking   QUEtiapine (SEROQUEL) 200 MG tablet Take 200 mg by mouth at bedtime. (Patient not taking: Reported on 02/09/2021)   Not Taking   topiramate (TOPAMAX) 50 MG tablet Take 50 mg by mouth 2 (two) times daily. (Patient not taking: Reported on 02/09/2021)   Not Taking   traZODone (DESYREL) 150 MG tablet Take 150 mg by mouth at bedtime. (Patient not taking: Reported on 02/09/2021)   Not Taking   TRINTELLIX 20 MG TABS tablet Take 20 mg by mouth daily. (Patient not taking:  Reported on 02/09/2021)   Not Taking    Patient Stressors: Marital or family conflict   Medication change or noncompliance    Patient Strengths: General fund of knowledge  Motivation for treatment/growth   Treatment Modalities: Medication Management, Group therapy, Case management,  1 to 1 session with clinician, Psychoeducation, Recreational therapy.   Physician Treatment Plan for Primary Diagnosis: Severe recurrent major depressive disorder with psychotic features (HCC) Long Term Goal(s): Improvement in symptoms so as ready for discharge   Short Term Goals: Ability to identify changes in lifestyle to reduce recurrence of condition will improve Ability to verbalize feelings will improve Ability to disclose and discuss suicidal ideas Ability to demonstrate self-control will improve Ability to identify and develop effective coping behaviors will improve Ability to maintain clinical measurements within normal limits will improve Compliance with prescribed medications will improve Ability to identify triggers associated with substance abuse/mental health issues will improve  Medication Management: Evaluate patient's response, side effects, and tolerance of medication regimen.  Therapeutic Interventions: 1 to 1 sessions, Unit Group sessions and Medication administration.  Evaluation of Outcomes: Not Progressing  Physician Treatment Plan for Secondary Diagnosis: Principal Problem:   Severe recurrent major depressive disorder with psychotic features (HCC) Active Problems:   Borderline personality disorder (HCC)   Nicotine dependence with current use   GAD (generalized anxiety disorder)  Long Term Goal(s): Improvement in symptoms so as ready for discharge   Short Term Goals: Ability to identify changes in lifestyle to reduce recurrence of condition will improve Ability to verbalize feelings will improve Ability to disclose and discuss suicidal ideas Ability to demonstrate  self-control will improve Ability to identify and develop effective coping behaviors will improve Ability to maintain clinical measurements within normal limits will improve Compliance with prescribed medications will improve Ability to identify triggers associated with substance abuse/mental health issues will improve     Medication Management: Evaluate patient's response, side effects, and tolerance of medication regimen.  Therapeutic Interventions: 1 to 1 sessions, Unit Group sessions and Medication administration.  Evaluation of Outcomes: Not Progressing   RN Treatment Plan for Primary Diagnosis: Severe recurrent major depressive disorder with psychotic features (HCC) Long Term Goal(s): Knowledge of disease and therapeutic regimen to maintain health will improve  Short Term Goals: Ability to demonstrate self-control, Ability to participate in decision making will improve, and Ability to verbalize feelings will improve  Medication Management: RN will administer medications as ordered by provider, will assess and evaluate patient's response and provide education to patient for prescribed medication. RN will report any adverse and/or side effects to  prescribing provider.  Therapeutic Interventions: 1 on 1 counseling sessions, Psychoeducation, Medication administration, Evaluate responses to treatment, Monitor vital signs and CBGs as ordered, Perform/monitor CIWA, COWS, AIMS and Fall Risk screenings as ordered, Perform wound care treatments as ordered.  Evaluation of Outcomes: Not Progressing   LCSW Treatment Plan for Primary Diagnosis: Severe recurrent major depressive disorder with psychotic features (HCC) Long Term Goal(s): Safe transition to appropriate next level of care at discharge, Engage patient in therapeutic group addressing interpersonal concerns.  Short Term Goals: Engage patient in aftercare planning with referrals and resources, Increase social support, and Increase ability  to appropriately verbalize feelings  Therapeutic Interventions: Assess for all discharge needs, 1 to 1 time with Social worker, Explore available resources and support systems, Assess for adequacy in community support network, Educate family and significant other(s) on suicide prevention, Complete Psychosocial Assessment, Interpersonal group therapy.  Evaluation of Outcomes: Not Progressing   Progress in Treatment: Attending groups: Yes. and No. Participating in groups: Yes. and No. Taking medication as prescribed: Yes. Toleration medication: Yes. Family/Significant other contact made: No, will contact:  Pt declined consents Patient understands diagnosis: No. Discussing patient identified problems/goals with staff: Yes. Medical problems stabilized or resolved: Yes. Denies suicidal/homicidal ideation: Yes. Issues/concerns per patient self-inventory: No. Other: None  New problem(s) identified: No, Describe:  None  New Short Term/Long Term Goal(s):medication stabilization, elimination of SI thoughts, development of comprehensive mental wellness plan.   Patient Goals:  "try to get depressive symptoms under control."  Discharge Plan or Barriers: Patient recently admitted. CSW will continue to follow and assess for appropriate referrals and possible discharge planning.   Reason for Continuation of Hospitalization: Anxiety Depression Medication stabilization  Estimated Length of Stay: 3-5 days   Scribe for Treatment Team: Chrys Racer 02/15/2021 10:45 AM

## 2021-02-15 NOTE — Progress Notes (Signed)
Pt didn't attend therapeutic relaxation group. 

## 2021-02-15 NOTE — Group Note (Signed)
Recreation Therapy Group Note   Group Topic:Other  Group Date: 02/15/2021 Start Time: 1010 End Time: 1039 Facilitators: Caroll Rancher, LRT,CTRS Location: [address] Dayroom   Goal Area(s) Addresses:  Patient will identify positive ways to deal with anxiety. Patient will identify benefits of using coping skills for anxiety post d/c.  Group Description: Introduction to Anxiety.  Patients were to identity what triggers anxiety.  Patients also identified the physical symptoms or thoughts experienced when anxious.  Lastly, patients identify coping skills that can be used to deal with anxiety.    Affect/Mood: Depressed   Participation Level: Moderate   Participation Quality: Independent   Behavior: Reserved   Speech/Thought Process: Distracted   Insight: Fair   Judgement: Fair    Modes of Intervention: Worksheet   Patient Response to Interventions:  Receptive   Education Outcome:  Acknowledges education and In group clarification offered    Clinical Observations/Individualized Feedback: Pt still seemed depressed and hopeless.  Pt had a hard time focusing on the activity.  Pt expressed not having any coping skills and not liking to do anything.  Pt expressed fear of the unknown, abandonment and physical/emotional pain cause anxiety.  Pt has symptoms of heart racing, palms sweating and heart palpitations when anxious.  Pt identified not specific thoughts she has when anxious and wrote deep breathing as a way to cope after saying it doesn't work for her.    Plan: Continue to engage patient in RT group sessions 2-3x/week.   Caroll Rancher, Antonietta Jewel 02/15/2021 12:33 PM

## 2021-02-15 NOTE — Progress Notes (Signed)
Patient was up in the dayroom, attended group and watched tv briefly before [place] started getting loud talking about some incident that happened on the previous shift. Nylee returned to her room as if [place] was making her feel nervous. Dori has been very quiet but cooperative. Safety maintained with 15 min checks.

## 2021-02-15 NOTE — Group Note (Signed)
Topic: Healthy Vs Unhealthy Coping Skills    Due to the acuity and complex discharge plans, group was not held. Patient was provided therapeutic worksheets and asked to meet with CSW as needed.   Ruthann Cancer MSW, LCSW Clincal Social Worker  [facility]

## 2021-02-15 NOTE — B[facility] Counselor (Signed)
CSW spoke with the Pt who states that she has been trying to attend more group sessions and has been trying to think more positive thoughts.  She states "I am not sure if I have ever been happy with myself and I think I lash out at people because of that".  The Pt states that she wants to work on herself and work on "not pushing people away".  The Pt accepted the packet of resources and states that she will look over them this weekend and begin thinking about residential treatment options as well.  The Pt was less tearful and able to communicate more effectively with the CSW today. CSW explained that the Pt may speak with any of the weekend staff about information in the packet including shelters, [address], and residential treatment centers.  CSW also explained to the Pt that she may speak with any of the weekend staff about feelings about herself or any thoughts or fears about the future.

## 2021-02-15 NOTE — [facility] Group Notes (Signed)
Adult Psychoeducational Group Note  Date:  02/15/2021 Time:  11:22 PM  Group Topic/Focus:  Making Healthy Choices:   The focus of this group is to help patients identify negative/unhealthy choices they were using prior to admission and identify positive/healthier coping strategies to replace them upon discharge.  Participation Level:  Minimal  Participation Quality:  Appropriate  Affect:  Appropriate  Cognitive:  Appropriate  Insight: Good  Engagement in Group:  Engaged  Modes of Intervention:  Discussion  Additional Comments  Jacalyn Lefevre 02/15/2021, 11:22 PM

## 2021-02-15 NOTE — Progress Notes (Signed)
   02/15/21 1000  Psych Admission Type (Psych Patients Only)  Admission Status Involuntary  Psychosocial Assessment  Patient Complaints Irritability  Eye Contact Brief  Facial Expression Pained  Affect Sad  Speech Soft  Interaction Minimal  Motor Activity Slow  Appearance/Hygiene Disheveled  Behavior Characteristics Cooperative  Mood Depressed;Anxious  Thought Process  Coherency Circumstantial  Content WDL  Delusions None reported or observed  Perception WDL  Hallucination None reported or observed  Judgment Poor  Confusion None  Danger to Self  Current suicidal ideation? Passive  Self-Injurious Behavior No self-injurious ideation or behavior indicators observed or expressed   Agreement Not to Harm Self Yes  Description of Agreement verbal contract to approach staff  Danger to Others  Danger to Others None reported or observed

## 2021-02-15 NOTE — [facility] Counselor (Signed)
CSW provided the Pt with a packet that contained information about housing/shelter listings, free/reduced price food resources, IRC information, a [employer] form and bus routes, [employer], PCP information, and suicide prevention information.

## 2021-02-15 NOTE — Progress Notes (Signed)
[facility] MD Progress Note  02/15/2021 5:11 PM Anna [name]  MRN:  [id number] Subjective:    Patient is a 50 years old admitted under IVC from [facility] for MDD with Psychotic features with known psychiatric hx of MDD recurrent severe without psychosis, alcohol use disorder with dependence, borderline personality disorder, cannabis use disorder, cocaine use disorder, GAD, PTSD, and suicide attempt.  On evaluation today, the patient reports feeling hopeless.  Her thought process appears somewhat disorganized.  She reports that her most troublesome symptom right now is her anxiety and depression.  She reports "I feel dead".  She reports that she has lost spiritual faith and hope.  She reports memory problems and is fearful that she has done something terrible to her roommates or others.  She reports repetitive thoughts that sound obsessive in nature but she refuses to elaborate on these at this time.  She says "I feel like people do not like me".  She reports passive suicidal ideation, saying "I wish I would not wake up".  She reports that she has 2 goals for the day one of which is to shower and the other which is to go to groups.  Later on in the day a YBOCS is performed, scoring an 8 out of 16 on the obsessive thoughts category.  She denies any compulsions.  Principal Problem: Severe recurrent major depressive disorder with psychotic features (HCC) Diagnosis: Principal Problem:   Severe recurrent major depressive disorder with psychotic features (HCC) Active Problems:   Borderline personality disorder (HCC)   Nicotine dependence with current use   GAD (generalized anxiety disorder)  Total Time spent with patient: 30 minutes  Past Psychiatric History: See H&P  Past Medical History:  Past Medical History:  Diagnosis Date   Anxiety    Borderline personality disorder in adult [facility])    Hearing deficit 07/13/2020   MDD (major depressive disorder)    History reviewed. No pertinent surgical  history. Family History: History reviewed. No pertinent family history. Family Psychiatric  History: See H&P Social History:  Social History   Substance and Sexual Activity  Alcohol Use Not Currently     Social History   Substance and Sexual Activity  Drug Use Not Currently   Types: Cocaine, Marijuana    Social History   Socioeconomic History   Marital status: Single    Spouse name: Not on file   Number of children: Not on file   Years of education: Not on file   Highest education level: Not on file  Occupational History   Not on file  Tobacco Use   Smoking status: Some Days    Packs/day: 0.20    Years: 40.00    Pack years: 8.00    Types: Cigarettes   Smokeless tobacco: Never  Vaping Use   Vaping Use: Every day   Start date: 04/07/2020   Substances: Nicotine, Flavoring  Substance and Sexual Activity   Alcohol use: Not Currently   Drug use: Not Currently    Types: Cocaine, Marijuana   Sexual activity: Not on file  Other Topics Concern   Not on file  Social History Narrative   Not on file   Social Determinants of Health   Financial Resource Strain: Not on file  Food Insecurity: Not on file  Transportation Needs: Not on file  Physical Activity: Not on file  Stress: Not on file  Social Connections: Not on file   Additional Social History:     Sleep: Fair  Appetite:  Fair  Current Medications: Current Facility-Administered Medications  Medication Dose Route Frequency Provider Last Rate Last Admin   acetaminophen (TYLENOL) tablet 650 mg  650 mg Oral Q6H PRN Laveda Abbe, NP       hydrOXYzine (ATARAX/VISTARIL) tablet 25 mg  25 mg Oral TID PRN Dahlia Byes C, NP   25 mg at 02/14/21 1005   ibuprofen (ADVIL) tablet 600 mg  600 mg Oral Q6H PRN Laveda Abbe, NP   600 mg at 02/14/21 1307   OLANZapine zydis (ZYPREXA) disintegrating tablet 5 mg  5 mg Oral Q8H PRN Laveda Abbe, NP       And   LORazepam (ATIVAN) tablet 1 mg  1 mg Oral  PRN Laveda Abbe, NP       And   ziprasidone (GEODON) injection 20 mg  20 mg Intramuscular PRN Laveda Abbe, NP       melatonin tablet 5 mg  5 mg Oral QHS Dahlia Byes C, NP   5 mg at 02/14/21 2138   multivitamin with minerals tablet 1 tablet  1 tablet Oral Daily Massengill, Harrold Donath, MD   1 tablet at 02/15/21 0833   OLANZapine zydis (ZYPREXA) disintegrating tablet 15 mg  15 mg Oral QHS Carlyn Reichert, MD       And   Melene Muller ON 02/16/2021] OLANZapine zydis (ZYPREXA) disintegrating tablet 5 mg  5 mg Oral Daily Carlyn Reichert, MD       polyethylene glycol (MIRALAX / GLYCOLAX) packet 17 g  17 g Oral Daily PRN Laveda Abbe, NP       prazosin (MINIPRESS) capsule 1 mg  1 mg Oral QHS Laveda Abbe, NP   1 mg at 02/14/21 2138   propranolol (INDERAL) tablet 10 mg  10 mg Oral TID Phineas Inches, MD   10 mg at 02/15/21 1155   senna (SENOKOT) tablet 8.6 mg  1 tablet Oral Daily PRN Laveda Abbe, NP       Melene Muller ON 02/16/2021] sertraline (ZOLOFT) tablet 100 mg  100 mg Oral Daily Massengill, Nathan, MD       thiamine tablet 100 mg  100 mg Oral Daily Massengill, Harrold Donath, MD   100 mg at 02/15/21 7026    Lab Results:  No results found for this or any previous visit (from the past 48 hour(s)).   Blood Alcohol level:  Lab Results  Component Value Date   ETH <10 02/08/2021   ETH <10 07/13/2020    Metabolic Disorder Labs: Lab Results  Component Value Date   HGBA1C 5.7 (H) 02/08/2021   MPG 116.89 02/08/2021   No results found for: PROLACTIN Lab Results  Component Value Date   CHOL 222 (H) 02/11/2021   TRIG 200 (H) 02/11/2021   HDL 41 02/11/2021   CHOLHDL 5.4 02/11/2021   VLDL 40 02/11/2021   LDLCALC 141 (H) 02/11/2021   LDLCALC 187 (H) 02/08/2021    Physical Findings: AIMS: not yet assessed CIWA:  CIWA-Ar Total: 1 COWS:     Musculoskeletal: Strength & Muscle Tone: within normal limits Gait & Station: normal Patient leans:  N/A  Psychiatric Specialty Exam:  Presentation  General Appearance: Disheveled  Eye Contact:Fleeting  Speech:Normal Rate  Speech Volume:Decreased  Handedness:Right   Mood and Affect  Mood: "hopeless" Affect:Tearful; Labile   Thought Process  Thought Processes:Disorganized Perhaps improved from yesterday  Descriptions of Associations:Tangential  Orientation:Partial  Thought Content:Tangential  History of Schizophrenia/Schizoaffective disorder:No  Duration of Psychotic Symptoms:N/A  Hallucinations:Hallucinations: None  Ideas  of Reference:None  Suicidal Thoughts:Suicidal Thoughts: Yes, Passive SI Active Intent and/or Plan: Without Plan; Without Intent  Homicidal Thoughts:Homicidal Thoughts: No   Sensorium  Memory:Immediate Fair; Recent Fair; Remote Fair  Judgment:Poor  Insight:Poor   Executive Functions  Concentration:Poor  Attention Span:Poor  Recall:Poor  Fund of Knowledge:Poor  Language:Fair   Psychomotor Activity  Psychomotor Activity:Psychomotor Activity: Decreased   Assets  Assets:Desire for Improvement; Manufacturing systems engineer; Financial Resources/Insurance; Housing   Sleep  Sleep:Sleep: Good    Physical Exam: Physical Exam Constitutional:      General: She is not in acute distress.    Appearance: She is normal weight. She is not ill-appearing or toxic-appearing.  Pulmonary:     Effort: Pulmonary effort is normal. No respiratory distress.  Neurological:     Mental Status: She is alert.     Motor: No weakness.     Gait: Gait normal.   Review of Systems  Gastrointestinal:  Negative for nausea and vomiting.  Neurological:  Negative for headaches.  All other systems reviewed and are negative. Blood pressure 126/85, pulse 65, temperature 98.6 F (37 C), temperature source Oral, resp. rate 18, height 5\' 8"  (1.727 m), weight 100.7 kg, SpO2 100 %. Body mass index is 33.75 kg/m.   Treatment Plan Summary: Daily contact with  patient to assess and evaluate symptoms and progress in treatment and Medication management.    MDD, recurrent severe with psychotic features:  -Change Zyprexa to 5 mg AM / 15 mg at 1900, for psychosis -Will strongly consider starting patient on metformin as we continue with Zyprexa -Increase Zoloft to 100 mg daily, for depression and obsessive thoughts (patient scored 8/16 on YBOCS obssessive thoughts portion, will continue to explore this with her) Continue propranolol 10 mg 3 times daily, for anxiety   Anxiety:   Continue Hydroxyzine 25 mg po tid as needed for anxiety   Insomnia:  -Continue Melatonin 5 mg po at bed time for sleep.    PTSD.  Continue Minipress 1 mg po Q hs.   Agitation protocol:  Geodon 20 mg IM as needed for agitation Ativan 1 mg PO as needed for anxiety Zyprexa Zydis 5 mg PO every 8 hours as needed for agitation.   Continue other prn medications as ordered for other medical complaints.   Continue every 15 Minute safety checks Encourage participation on the therapeutic milieu and group therapy  Discharge planning in progress  , MD 02/15/2021, 5:11 PM

## 2021-02-16 NOTE — [facility] Group Notes (Signed)
Adult Psychoeducational Group Note  Date:  02/16/2021 Time:  11:51 PM  Group Topic/Focus:  Conflict Resolution:   The focus of this group is to discuss the conflict resolution process and how it may be used upon discharge.  Participation Level:  Minimal  Participation Quality:  Appropriate  Affect:  Appropriate  Cognitive:  Alert  Insight: Good  Engagement in Group:  Engaged  Modes of Intervention:  Discussion  Additional Comments  Anna [name] 02/16/2021, 11:51 PM

## 2021-02-16 NOTE — [facility] Group Notes (Signed)
[facility] Group Notes:  (Nursing/MHT/Case Management/Adjunct)  Date:  02/16/2021  Time:  11:05 AM  Type of Therapy:   Orientation/Goals group  Participation Level:  Minimal  Participation Quality:  Appropriate  Affect:  Appropriate  Cognitive:  Appropriate  Insight:  Improving  Engagement in Group:  Engaged and Improving  Modes of Intervention:  Discussion, Orientation, and Support  Summary of Progress/Problems: Pt goal for today is to make phone calls to find a place to go for when she gets discharged.   Anna [name] [name] 02/16/2021, 11:05 AM

## 2021-02-16 NOTE — Progress Notes (Addendum)
[facility] MD Progress Note  02/16/2021 3:47 PM Anna [name]  MRN:  [id number] Subjective:  Patient is a 50 years old admitted under IVC from [facility] for MDD with Psychotic features with known psychiatric hx of MDD recurrent severe without psychosis, alcohol use disorder with dependence, borderline personality disorder, cannabis use disorder, cocaine use disorder, GAD, PTSD, and suicide attempt.  The patient's chart was reviewed and nursing notes were reviewed. Over the past 24 hrs, there were no documented behavioral issues, no PRN medications given for agitation.The patient's case was discussed in multidisciplinary team meeting.  It was discussed that the patient had presented with agitation initially and that is why Zyprexa was chosen for the patient.  It was also discussed that the patient cannot return to her [place] [facility] at discharge. The patient was noted to have attended some groups.   On evaluation today, the patient appears similar to yesterday.  She is labile and cries.  She reports that she is "so tired" and that she has a voices in her head that constantly tell her "you are no good". She is unable to answer when asked if this is negative self-talk or true AH.  She reports having lived her whole life this way.  She appears exasperated and states that she has suicidal thoughts without a particular plan.  She contracts for safety.  Regarding HI, she says that she could "lash out" but says that the target would be herself. She denies visual hallucinations,ideas of reference, and first rank symptoms.  Goalsetting is done at the end of the interview and the patient is to call 1 potential disposition option.  Therapy with metformin was offered given the patient is on Zyprexa.  The patient refuses.  Principal Problem: Severe recurrent major depressive disorder with psychotic features (HCC) Diagnosis: Principal Problem:   Severe recurrent major depressive disorder with psychotic features  (HCC) Active Problems:   Borderline personality disorder (HCC)   Nicotine dependence with current use   GAD (generalized anxiety disorder)  Total Time spent with patient: 30 minutes  Past Psychiatric History: See H&P  Past Medical History:  Past Medical History:  Diagnosis Date   Anxiety    Borderline personality disorder in adult [facility])    Hearing deficit 07/13/2020   MDD (major depressive disorder)    History reviewed. No pertinent surgical history. Family History: History reviewed. No pertinent family history. Family Psychiatric  History: See H&P Social History:  Social History   Substance and Sexual Activity  Alcohol Use Not Currently     Social History   Substance and Sexual Activity  Drug Use Not Currently   Types: Cocaine, Marijuana    Social History   Socioeconomic History   Marital status: Single    Spouse name: Not on file   Number of children: Not on file   Years of education: Not on file   Highest education level: Not on file  Occupational History   Not on file  Tobacco Use   Smoking status: Some Days    Packs/day: 0.20    Years: 40.00    Pack years: 8.00    Types: Cigarettes   Smokeless tobacco: Never  Vaping Use   Vaping Use: Every day   Start date: 04/07/2020   Substances: Nicotine, Flavoring  Substance and Sexual Activity   Alcohol use: Not Currently   Drug use: Not Currently    Types: Cocaine, Marijuana   Sexual activity: Not on file  Other Topics Concern   Not  on file  Social History Narrative   Not on file   Social Determinants of Health   Financial Resource Strain: Not on file  Food Insecurity: Not on file  Transportation Needs: Not on file  Physical Activity: Not on file  Stress: Not on file  Social Connections: Not on file   Additional Social History:     Sleep: Good  Appetite:  Fair  Current Medications: Current Facility-Administered Medications  Medication Dose Route Frequency Provider Last Rate Last Admin    acetaminophen (TYLENOL) tablet 650 mg  650 mg Oral Q6H PRN Laveda Abbe, NP       hydrOXYzine (ATARAX/VISTARIL) tablet 25 mg  25 mg Oral TID PRN Dahlia Byes C, NP   25 mg at 02/16/21 1218   ibuprofen (ADVIL) tablet 600 mg  600 mg Oral Q6H PRN Laveda Abbe, NP   600 mg at 02/14/21 1307   OLANZapine zydis (ZYPREXA) disintegrating tablet 5 mg  5 mg Oral Q8H PRN Laveda Abbe, NP   5 mg at 02/16/21 1218   And   LORazepam (ATIVAN) tablet 1 mg  1 mg Oral PRN Laveda Abbe, NP       And   ziprasidone (GEODON) injection 20 mg  20 mg Intramuscular PRN Laveda Abbe, NP       melatonin tablet 5 mg  5 mg Oral QHS Onuoha, Josephine C, NP   5 mg at 02/15/21 2059   multivitamin with minerals tablet 1 tablet  1 tablet Oral Daily Massengill, Harrold Donath, MD   1 tablet at 02/16/21 0826   OLANZapine zydis (ZYPREXA) disintegrating tablet 15 mg  15 mg Oral QHS Carlyn Reichert, MD   15 mg at 02/15/21 2058   And   OLANZapine zydis (ZYPREXA) disintegrating tablet 5 mg  5 mg Oral Daily Carlyn Reichert, MD   5 mg at 02/16/21 0826   polyethylene glycol (MIRALAX / GLYCOLAX) packet 17 g  17 g Oral Daily PRN Laveda Abbe, NP       prazosin (MINIPRESS) capsule 1 mg  1 mg Oral QHS Laveda Abbe, NP   1 mg at 02/15/21 2059   propranolol (INDERAL) tablet 10 mg  10 mg Oral TID Phineas Inches, MD   10 mg at 02/16/21 1256   senna (SENOKOT) tablet 8.6 mg  1 tablet Oral Daily PRN Laveda Abbe, NP       sertraline (ZOLOFT) tablet 100 mg  100 mg Oral Daily Massengill, Harrold Donath, MD   100 mg at 02/16/21 0825   thiamine tablet 100 mg  100 mg Oral Daily Massengill, Harrold Donath, MD   100 mg at 02/16/21 0827    Lab Results:  No results found for this or any previous visit (from the past 48 hour(s)).   Blood Alcohol level:  Lab Results  Component Value Date   ETH <10 02/08/2021   ETH <10 07/13/2020    Metabolic Disorder Labs: Lab Results  Component Value Date    HGBA1C 5.7 (H) 02/08/2021   MPG 116.89 02/08/2021   No results found for: PROLACTIN Lab Results  Component Value Date   CHOL 222 (H) 02/11/2021   TRIG 200 (H) 02/11/2021   HDL 41 02/11/2021   CHOLHDL 5.4 02/11/2021   VLDL 40 02/11/2021   LDLCALC 141 (H) 02/11/2021   LDLCALC 187 (H) 02/08/2021    Physical Findings: AIMS: not yet assessed CIWA:  CIWA-Ar Total: 4  Musculoskeletal: Strength & Muscle Tone: within normal limits Gait & Station:  normal Patient leans: N/A  Psychiatric Specialty Exam:  Presentation  General Appearance: Disheveled  Eye Contact:Fleeting  Speech:Normal Rate  Speech Volume:Increased  Handedness:Right   Mood and Affect  Mood: "hopeless" - appears irritable  Affect:Teaful, labile, irritable  Thought Processes: ruminative and concrete  Orientation:uncooperative for questioning  Thought Content: Reports negative thoughts and hearing derogatory comments - unclear if are hallucinations or negative self-dialogue; denies VH, ideas of reference, or first rank symptoms; does not make delusional statements on exam; is not grossly responding to internal/external stimuli on exam  History of Schizophrenia/Schizoaffective disorder:No  Duration of Psychotic Symptoms:N/A  Hallucinations: hearing negative comments - unclear if negative self-dialogue or AH  Ideas of Reference:None  Suicidal Thoughts: passive suicidal thoughts, contracts for safety  Homicidal Thoughts: says that she could "lash out" but that the target would be herself   Sensorium  Memory:Immediate Fair; Recent Fair; Remote Fair  Judgment:Poor  Insight:Poor   Executive Functions  Concentration:Fair  Attention Span:Fair  Recall:untested  Fund of Knowledge:Fair  Language:Fair   Psychomotor Activity  Psychomotor Activity: normal   Assets  Assets:Desire for Improvement; Manufacturing systems engineer; Financial Resources/Insurance; Housing   Sleep  Sleep: 7.25  hours  Physical Exam Vitals and nursing note reviewed.  HENT:     Head: Normocephalic.  Pulmonary:     Effort: Pulmonary effort is normal.  Neurological:     General: No focal deficit present.     Mental Status: She is alert.   Review of Systems  Respiratory:  Negative for shortness of breath.   Cardiovascular:  Negative for chest pain.  Gastrointestinal:  Negative for nausea and vomiting.  Neurological:  Negative for headaches.  Blood pressure 110/79, pulse 76, temperature 98.5 F (36.9 C), temperature source Oral, resp. rate 18, height 5\' 8"  (1.727 m), weight 100.7 kg, SpO2 98 %. Body mass index is 33.75 kg/m.   Treatment Plan Summary: Daily contact with patient to assess and evaluate symptoms and progress in treatment and Medication management.    MDD, recurrent severe with psychotic features:  -Continue Zyprexa zydis 5 mg AM / 15 mg qhs -Patient has rejected metformin -Consider transition to [facility] given past non-compliance -Continue Zoloft 100 mg daily, for depression and obsessive thoughts (patient scored 8/16 on YBOCS obssessive thoughts portion, will continue to explore this with her). Consider increase on 11/14.  Continue propranolol 10 mg 3 times daily, for anxiety   Anxiety:   Continue Hydroxyzine 25 mg po tid as needed for anxiety   Insomnia:  -Continue Melatonin 5 mg po at bed time for sleep.    PTSD.  Continue Minipress 1 mg po Q hs.   Agitation protocol:  Geodon 20 mg IM as needed for agitation Ativan 1 mg PO as needed for anxiety Zyprexa Zydis 5 mg PO every 8 hours as needed for agitation.   Continue other prn medications as ordered for other medical complaints.   Continue every 15 Minute safety checks Encourage participation on the therapeutic milieu and group therapy  Discharge planning in progress  12/14, MD 02/16/2021, 3:47 PM

## 2021-02-16 NOTE — Group Note (Signed)
  [facility] LCSW Group Therapy Note  Date/Time:  02/16/2021 11:15AM-12:00PM  Type of Therapy and Topic:  Group Therapy:  Feelings About Hospitalization  Participation Level:  Active   Description of Group This process group involved patients discussing their feelings related to being hospitalized, as well as the benefits they see to being in the hospital.  These feelings and benefits were itemized.  The group then brainstormed specific ways in which they could seek those same benefits when they discharge and return home.  Therapeutic Goals Patient will identify and describe positive and negative feelings related to hospitalization Patient will verbalize benefits of hospitalization to themselves personally Patients will brainstorm together ways they can obtain similar benefits in the outpatient setting, identify barriers to wellness and possible solutions  Summary of Patient Progress:  The patient expressed her primary feelings about being hospitalized are "overwhelmed."  She seemed somewhat tearful several times, but eventually started volunteering statements about some of the benefits of being in the hospital.  She was given positive strokes for her interactions both during group and afterward, but said that she did not feel she had done well in group despite what CSW was saying.  She ducked her head down in a manner indicating shame, became tearful and left the hallway at that point.  Therapeutic Modalities Cognitive Behavioral Therapy Motivational Interviewing    Ambrose Mantle, LCSW 02/16/2021, 3:01 PM

## 2021-02-17 DIAGNOSIS — F333 Major depressive disorder, recurrent, severe with psychotic symptoms: Secondary | ICD-10-CM | POA: Diagnosis not present

## 2021-02-17 MED ORDER — SERTRALINE HCL 50 MG PO TABS
150.0000 mg | ORAL_TABLET | Freq: Every day | ORAL | Status: DC
  Administered 2021-02-18 – 2021-02-19 (×2): 150 mg via ORAL
  Filled 2021-02-17 (×4): qty 1

## 2021-02-17 NOTE — Progress Notes (Signed)
   02/17/21 0616  Vital Signs  Pulse Rate 95  Pulse Rate Source Monitor  BP (!) 84/63  BP Location Right Arm  BP Method Automatic  Patient Position (if appropriate) Standing  Oxygen Therapy  SpO2 98 %   D: Patient admits to some SI but denies HI/AVH. Pt. Rated anxiety 10/10 and depression 1/10. Pt. Walked out of group. Pt. Reported that it was too irritating that the other pts, were laughing  and "not taking it serious" A:  Patient took scheduled medicine.  Support and encouragement provided Routine safety checks conducted every 15 minutes. Patient  Informed to notify staff with any concerns.   R:  Safety maintained.

## 2021-02-17 NOTE — [facility] Group Notes (Signed)
Adult Psychoeducational Group Not Date:  02/17/2021 Time:  0900-1045 Group Topic/Focus: PROGRESSIVE RELAXATION. A group where deep breathing is taught and tensing and relaxation muscle groups is used. Imagery is used as well.  Pts are asked to imagine 3 pillars that hold them up when they are not able to hold themselves up.  Participation Level:  walked in the room and walk back out. Did not attend   Dione Housekeeper

## 2021-02-17 NOTE — Progress Notes (Incomplete)
D: Pt. Admits to SI but contracts for safety. PT. Denies HI. Pt. Reported that her "perceptions aren't that good" Pt.rated  both anxiety 1 and depression 10/10. Pt. Isolated in her room. A:  Patient took scheduled medicine.  Support and encouragement provided Routine safety checks conducted every 15 minutes. Patient  Informed to notify staff with any concerns.   jR:  Safety maintained.

## 2021-02-17 NOTE — Group Note (Signed)
[facility] LCSW Group Therapy Note  Date/Time:  02/17/2021  11:00AM-12:00PM  Type of Therapy and Topic:  Group Therapy:  Music and Mood  Participation Level:  Did Not Attend   Description of Group: In this process group, members listened to a variety of genres of music and identified that different types of music evoke different responses.  Patients were encouraged to identify music that was soothing for them and music that was energizing for them.  Patients discussed how this knowledge can help with wellness and recovery in various ways including managing depression and anxiety as well as encouraging healthy sleep habits.    Therapeutic Goals: Patients will explore the impact of different varieties of music on mood Patients will verbalize the thoughts they have when listening to different types of music Patients will identify music that is soothing to them as well as music that is energizing to them Patients will discuss how to use this knowledge to assist in maintaining wellness and recovery Patients will explore the use of music as a coping skill  Summary of Patient Progress:  The patient was invited to group, chose not to attend.  Therapeutic Modalities: Solution Focused Brief Therapy Activity   Ambrose Mantle, LCSW

## 2021-02-17 NOTE — Progress Notes (Signed)
Pt was encouraged but didn't attend orientation/goals group. ?

## 2021-02-17 NOTE — Progress Notes (Signed)
Pt was encouraged but didn't attend psycho-ed group.  

## 2021-02-17 NOTE — Progress Notes (Signed)
[facility] MD Progress Note  02/17/2021 3:05 PM Anna [name]  MRN:  [id number]  Subjective: "I am not doing good at all. I am tired, tired all the time"   Objective:  Patient is a 50 years old admitted under IVC from [facility] for MDD with Psychotic features with known psychiatric hx of MDD recurrent severe without psychosis, alcohol use disorder with dependence, borderline personality disorder, cannabis use disorder, cocaine use disorder, GAD, PTSD, and suicide attempt.  Evaluation on the unit today 11/13: Patient was seen, chart was reviewed and case discussed with the treatment team. Over the past 24 hrs, patient receive Zyprexa PRN given for irritability and agitation. It was also discussed that the patient cannot return to her [place] [facility] at discharge. The patient was noted to have attended some groups.   Today, the patient appears as irritable and agitated. She reports that she is "tired, tired all the time." When asked about SI she becomes irate and stated "that's all that comes out of you isn't it? Yes, I want to kill myself."  She stated she feels everything is directed at her and she feels very threatened. When asked why she feels threatened or by who, she does not answer. She contracts for safety. She denies AVH/HI, paranoia and delusions. She stated she tried to call a shelter yesterday but got a voice mail. She was encouraged to try again today or in the morning. She replied, "I know what is about to happen, I just need to know, are you going to discharge me today." She was told no because we have no place to send her. To this she replied, "I have seen you send people out of here with no plan." She is argumentative, helpless, negative, angry and self-defeating. She does not believe she will get better or that we will be able to do anything to help her. She answered "I guess I did" when asked about sleep. Record shows she slept 8.25 hours. She stated her appetite is "a little."   Principal  Problem: Severe recurrent major depressive disorder with psychotic features (HCC) Diagnosis: Principal Problem:   Severe recurrent major depressive disorder with psychotic features (HCC) Active Problems:   Borderline personality disorder (HCC)   Nicotine dependence with current use   GAD (generalized anxiety disorder)  Total Time spent with patient: 30 minutes  Past Psychiatric History: See H&P  Past Medical History:  Past Medical History:  Diagnosis Date   Anxiety    Borderline personality disorder in adult [facility])    Hearing deficit 07/13/2020   MDD (major depressive disorder)    History reviewed. No pertinent surgical history. Family History: History reviewed. No pertinent family history. Family Psychiatric  History: See H&P Social History:  Social History   Substance and Sexual Activity  Alcohol Use Not Currently     Social History   Substance and Sexual Activity  Drug Use Not Currently   Types: Cocaine, Marijuana    Social History   Socioeconomic History   Marital status: Single    Spouse name: Not on file   Number of children: Not on file   Years of education: Not on file   Highest education level: Not on file  Occupational History   Not on file  Tobacco Use   Smoking status: Some Days    Packs/day: 0.20    Years: 40.00    Pack years: 8.00    Types: Cigarettes   Smokeless tobacco: Never  Vaping Use   Vaping  Use: Every day   Start date: 04/07/2020   Substances: Nicotine, Flavoring  Substance and Sexual Activity   Alcohol use: Not Currently   Drug use: Not Currently    Types: Cocaine, Marijuana   Sexual activity: Not on file  Other Topics Concern   Not on file  Social History Narrative   Not on file   Social Determinants of Health   Financial Resource Strain: Not on file  Food Insecurity: Not on file  Transportation Needs: Not on file  Physical Activity: Not on file  Stress: Not on file  Social Connections: Not on file   Additional Social  History:     Sleep: Good  Appetite:  Fair  Current Medications: Current Facility-Administered Medications  Medication Dose Route Frequency Provider Last Rate Last Admin   acetaminophen (TYLENOL) tablet 650 mg  650 mg Oral Q6H PRN Laveda Abbe, NP       hydrOXYzine (ATARAX/VISTARIL) tablet 25 mg  25 mg Oral TID PRN Dahlia Byes C, NP   25 mg at 02/17/21 0837   ibuprofen (ADVIL) tablet 600 mg  600 mg Oral Q6H PRN Laveda Abbe, NP   600 mg at 02/16/21 1833   OLANZapine zydis (ZYPREXA) disintegrating tablet 5 mg  5 mg Oral Q8H PRN Laveda Abbe, NP   5 mg at 02/17/21 1025   And   LORazepam (ATIVAN) tablet 1 mg  1 mg Oral PRN Laveda Abbe, NP       And   ziprasidone (GEODON) injection 20 mg  20 mg Intramuscular PRN Laveda Abbe, NP       melatonin tablet 5 mg  5 mg Oral QHS Onuoha, Josephine C, NP   5 mg at 02/16/21 2007   multivitamin with minerals tablet 1 tablet  1 tablet Oral Daily Massengill, Harrold Donath, MD   1 tablet at 02/17/21 0832   OLANZapine zydis (ZYPREXA) disintegrating tablet 15 mg  15 mg Oral QHS Carlyn Reichert, MD   15 mg at 02/16/21 2008   And   OLANZapine zydis (ZYPREXA) disintegrating tablet 5 mg  5 mg Oral Daily Carlyn Reichert, MD   5 mg at 02/17/21 3220   polyethylene glycol (MIRALAX / GLYCOLAX) packet 17 g  17 g Oral Daily PRN Laveda Abbe, NP       prazosin (MINIPRESS) capsule 1 mg  1 mg Oral QHS Laveda Abbe, NP   1 mg at 02/16/21 2008   propranolol (INDERAL) tablet 10 mg  10 mg Oral TID Phineas Inches, MD   10 mg at 02/17/21 1300   senna (SENOKOT) tablet 8.6 mg  1 tablet Oral Daily PRN Laveda Abbe, NP       sertraline (ZOLOFT) tablet 100 mg  100 mg Oral Daily Massengill, Harrold Donath, MD   100 mg at 02/17/21 2542   thiamine tablet 100 mg  100 mg Oral Daily Massengill, Harrold Donath, MD   100 mg at 02/17/21 7062    Lab Results:  No results found for this or any previous visit (from the past 48  hour(s)).   Blood Alcohol level:  Lab Results  Component Value Date   ETH <10 02/08/2021   ETH <10 07/13/2020    Metabolic Disorder Labs: Lab Results  Component Value Date   HGBA1C 5.7 (H) 02/08/2021   MPG 116.89 02/08/2021   No results found for: PROLACTIN Lab Results  Component Value Date   CHOL 222 (H) 02/11/2021   TRIG 200 (H) 02/11/2021   HDL  41 02/11/2021   CHOLHDL 5.4 02/11/2021   VLDL 40 02/11/2021   LDLCALC 141 (H) 02/11/2021   LDLCALC 187 (H) 02/08/2021    Physical Findings: AIMS: not yet assessed CIWA:  CIWA-Ar Total: 2  Musculoskeletal: Strength & Muscle Tone: within normal limits Gait & Station: normal Patient leans: N/A  Psychiatric Specialty Exam:  Presentation  General Appearance: Casual  Eye Contact:Fair  Speech:Clear and Coherent  Speech Volume:Increased  Handedness:Right  Mood and Affect  Mood: "hopeless",  irritable  Affect: labile, irritable, angry, negative  Thought Processes: ruminative and concrete  Orientation: alert, oriented, aware of situation  Thought Content: coherent, negative, self-defeating  History of Schizophrenia/Schizoaffective disorder:No  Duration of Psychotic Symptoms:N/A  Hallucinations: denies  Ideas of Reference:None  Suicidal Thoughts: passive suicidal thoughts, contracts for safety  Homicidal Thoughts: denies   Sensorium  Memory:Immediate Fair; Recent Fair; Remote Fair  Judgment:Poor  Insight:Poor  Executive Functions  Concentration:Fair  Attention Span:Fair  Recall:untested  Fund of Knowledge:Fair  Language:Good  Psychomotor Activity  Psychomotor Activity: normal  Assets  Assets:Communication Skills; Financial Resources/Insurance; Resilience  Sleep  Sleep: 8.25 hours  Physical Exam Vitals and nursing note reviewed.  HENT:     Head: Normocephalic.  Pulmonary:     Effort: Pulmonary effort is normal.  Musculoskeletal:        General: Normal range of motion.      Cervical back: Normal range of motion.  Neurological:     General: No focal deficit present.     Mental Status: She is alert.   Review of Systems  Respiratory:  Negative for shortness of breath.   Cardiovascular:  Negative for chest pain.  Gastrointestinal:  Negative for nausea and vomiting.  Neurological:  Negative for headaches.   Blood pressure 126/79, pulse 81, temperature 98.6 F (37 C), temperature source Oral, resp. rate 16, height 5\' 8"  (1.727 m), weight 100.7 kg, SpO2 98 %. Body mass index is 33.75 kg/m.  Treatment Plan Summary: Daily contact with patient to assess and evaluate symptoms and progress in treatment and Medication management.    MDD, recurrent severe with psychotic features:  -Continue Zyprexa zydis 5 mg AM / 15 mg qhs -Patient has rejected metformin -Consider transition to [facility]I given past non-compliance -Increase Zoloft to 150 mg PO daily, starting 11/14,  for depression and obsessive thoughts (patient scored 8/16 on YBOCS obssessive thoughts portion, will continue to explore this with her).    Anxiety:  -Continue Hydroxyzine 25 mg po tid as needed for anxiety -Continue propranolol 10 mg 3 times daily, for anxiety Insomnia:  -Continue Melatonin 5 mg po at bed time for sleep.    PTSD.  -Continue Minipress 1 mg po Q hs.   Agitation protocol:  Geodon 20 mg IM as needed for agitation Ativan 1 mg PO as needed for anxiety Zyprexa Zydis 5 mg PO every 8 hours as needed for agitation.   Continue other prn medications as ordered for other medical complaints.   Continue every 15 Minute safety checks Encourage participation on the therapeutic milieu and group therapy  Discharge planning in progress  12/14, NP 02/17/2021, 3:13 PM

## 2021-02-17 NOTE — Progress Notes (Signed)
Patient has been isolative to her room tonight d/t a female patient Anna [name]) arguing and yelling, using profanity out in the hallway. Koni came to medication window and was very Cytogeneticist asked if she felt okay and she reported that she just wanted to take her medicines and go lay down. Support given and safety maintained with 15 min check.

## 2021-02-18 NOTE — Progress Notes (Addendum)
Adult Psychoeducational Group Note  Date:  02/18/2021 Time:  8:22 PM  Group Topic/Focus:  Wrap-Up Group:   The focus of this group is to help patients review their daily goal of treatment and discuss progress on daily workbooks.  Participation Level:  Minimal  Participation Quality:  Appropriate  Affect:   Blunted, Irritable, Depressed  Cognitive:  Disorganized, Confused  Insight: Limited  Engagement in Group:   Limited, Poor  Modes of Intervention:  Discussion  Additional Comments:  Pt stated her goal for today was to focus on her treatment plan. Pt stated she accomplished her goal today. Pt stated she talked with her doctor and her social worker about her care today. Pt rated her overall day a 2 out of 10. Pt stated she made no calls today. Pt stated she did not feel good about herself tonight.  Pt stated she was able to attend all meals. Pt stated she took all medications provided today. Pt stated her appetite was pretty good today. Pt rated her sleep last night was pretty good. Pt stated the goal tonight was to get some rest. Pt stated she had some physical pain tonight.Pt stated she had some moderate pain in her lower back. Pt rated the moderate pain in her lower a 5 on the pain level scale. Pt nurse was update on the situation. Pt deny visual hallucinations and auditory issues tonight.  Pt denies thoughts of harming others. Pt admitted to having thoughts of harming herself.  Pt stated she could contract for safety. Pt nurse was updated on situation. Pt stated she would alert staff if anything changed.  Felipa Furnace 02/18/2021, 8:22 PM

## 2021-02-18 NOTE — Progress Notes (Signed)
   02/18/21 0816  Psych Admission Type (Psych Patients Only)  Admission Status Involuntary  Psychosocial Assessment  Eye Contact Brief  Facial Expression Flat  Affect Sad  Speech Soft  Interaction Minimal;Guarded;Poor  Motor Activity Slow  Appearance/Hygiene Improved  Behavior Characteristics Appropriate to situation  Mood Depressed;Anxious  Aggressive Behavior  Effect No apparent injury  Thought Process  Coherency Circumstantial  Content WDL  Delusions None reported or observed  Perception WDL  Hallucination Auditory  Judgment Poor  Confusion None  Danger to Self  Current suicidal ideation? Passive  Self-Injurious Behavior No self-injurious ideation or behavior indicators observed or expressed   Agreement Not to Harm Self Yes  Description of Agreement verbal contract to approach staff  Danger to Others  Danger to Others None reported or observed   D: Patient admitted to some passive SI but did not have a plan and did verbally contract for safety. Pt. Denies HI. Pt. Rated both anxiety and depression 10/10. Pt. Isolated in her room. A:  Patient took scheduled medicine.  Support and encouragement provided Routine safety checks conducted every 15 minutes. Patient  Informed to notify staff with any concerns.   R:  Safety maintained.

## 2021-02-18 NOTE — [facility] Group Notes (Signed)
Patient was encouraged to attend but did not show up to group.

## 2021-02-18 NOTE — Group Note (Signed)
LCSW Group Therapy Note   Group Date: 02/18/2021 Start Time: 1300 End Time: 1400   Type of Therapy and Topic:  Group Therapy: Attachment Styles  Participation Level:  Did Not Attend   Summary of Patient Progress:  Did not attend    Anna Newburn A [name], LCSW 02/18/2021  1:53 PM     

## 2021-02-18 NOTE — Progress Notes (Signed)
   02/18/21 2010  Psych Admission Type (Psych Patients Only)  Admission Status Involuntary  Psychosocial Assessment  Patient Complaints Anxiety;Depression  Eye Contact Brief  Facial Expression Anxious  Affect Anxious;Sad  Speech Soft  Interaction Minimal;Guarded;Poor  Motor Activity Slow  Appearance/Hygiene Improved  Behavior Characteristics Appropriate to situation;Anxious  Mood Depressed;Anxious;Sad  Thought Administrator, sports thinking  Content WDL  Delusions None reported or observed  Perception WDL  Hallucination None reported or observed  Judgment Poor  Confusion None  Danger to Self  Current suicidal ideation? Passive  Self-Injurious Behavior No self-injurious ideation or behavior indicators observed or expressed   Agreement Not to Harm Self Yes  Description of Agreement verbal contract to approach staff  Danger to Others  Danger to Others None reported or observed   Pt seen in her room. Pt endorses passive SI but contracts for safety. Pt sad and withdrawn. Pt denies HI, AVH. Pt rates pain 5/10 as chronic pain in lower back and abdomen. Pt rates anxiety 8/10 and depression 10/10. No root cause given. Pt says she has been experiencing lots of gas today. Last bowel movement was 02/17/21.Pt has not been attending groups although did get out of bed and go to wrap-up group tonight. Pt says appetite is good. Denied experiencing nightmares last night. Denies any withdrawal symptoms.

## 2021-02-18 NOTE — Progress Notes (Signed)
      02/17/21 2105  Psych Admission Type (Psych Patients Only)  Admission Status Involuntary  Psychosocial Assessment  Patient Complaints Anxiety  Eye Contact Brief  Facial Expression Flat  Affect Sad  Speech Soft  Interaction Minimal;Guarded;Poor  Motor Activity Slow  Appearance/Hygiene Improved  Behavior Characteristics Appropriate to situation  Mood Depressed;Sad  Aggressive Behavior  Effect No apparent injury  Thought Process  Coherency Circumstantial  Content WDL  Delusions None reported or observed  Perception WDL  Hallucination None reported or observed  Judgment Poor  Confusion None  Danger to Self  Current suicidal ideation? Passive  Self-Injurious Behavior No self-injurious ideation or behavior indicators observed or expressed   Agreement Not to Harm Self Yes  Description of Agreement verbal contract to approach staff  Danger to Others  Danger to Others None reported or observed

## 2021-02-18 NOTE — Group Note (Signed)
Recreation Therapy Group Note   Group Topic:Health and Wellness  Group Date: 02/18/2021 Start Time: 0955 End Time: 1035 Facilitators: Caroll Rancher, LRT,CTRS Location: [address] Day[address]   Goal Area(s) Addresses:  Patient will actively participate in selected exercises.  Patient will verbalize benefit of exercise during group session. Patient will identify an exercise that can be completed post d/c. Patient will acknowledge benefits of exercise when used as a coping mechanism.   Group Description:  LRT and patients focused the wellness component of exercise.  LRT led patients through a series of stretches to loosen the body and get prepared for the exercises to come.  Each person led group in exercises of their choosing.  Patients were informed to do the best they could and to not strain or do anything that would cause harm.  Patients were encouraged to get water and take breaks as needed.     Affect/Mood: N/A   Participation Level: Did not attend    Clinical Observations/Individualized Feedback:  Pt did not attend group.    Plan: Continue to engage patient in RT group sessions 2-3x/week.   Caroll Rancher, Antonietta Jewel 02/18/2021 12:51 PM

## 2021-02-18 NOTE — [facility] Group Notes (Signed)
Patient was encouraged to attend goals group but did not attend or submit self inventory sheet.

## 2021-02-18 NOTE — Progress Notes (Signed)
[facility] MD Progress Note  02/18/2021 2:58 PM Anna [name]  MRN:  [id number] Subjective:  Patient is a 50 year old female admitted under IVC from [facility] for MDD with Psychotic features with known psychiatric hx of MDD recurrent severe without psychosis, alcohol use disorder with dependence, borderline personality disorder, cannabis use disorder, cocaine use disorder, GAD, PTSD, and suicide attempt.  The patient's chart was reviewed and nursing notes were reviewed. Over the past 24 hrs, the patient was given Zyprexa 5 mg PO PRN for agitation. The patient's case was discussed in multidisciplinary team meeting. She was noted to have gone to one group but left early. She was noted have called some disposition options.    On evaluation today, the patient appears similar to previous days. Her affect is dysphoric. Her thought process is rigid and her thought content is negative. It is notable that her thought process is linear and she is able to engage in the interview, more so than on admission. The patient reports anxiety and depression that are severe, similar to previous days. She is congratulated for meeting one of her goals (calling disposition options). She discounts this and says that she wants to solve her anxiety and depression but does not know how. She says "I have been like this all my life" and says that she is hopeless. She reports suicidal thoughts with no specific plan. She does not engage well with cognitive therapy but calms down with gentle reassurance.    Principal Problem: Severe recurrent major depressive disorder with psychotic features (HCC) Diagnosis: Principal Problem:   Severe recurrent major depressive disorder with psychotic features (HCC) Active Problems:   Borderline personality disorder (HCC)   Nicotine dependence with current use   GAD (generalized anxiety disorder)  Total Time spent with patient: 30 minutes  Past Psychiatric History: See H&P  Past Medical  History:  Past Medical History:  Diagnosis Date   Anxiety    Borderline personality disorder in adult [facility])    Hearing deficit 07/13/2020   MDD (major depressive disorder)    History reviewed. No pertinent surgical history. Family History: History reviewed. No pertinent family history. Family Psychiatric  History: See H&P Social History:  Social History   Substance and Sexual Activity  Alcohol Use Not Currently     Social History   Substance and Sexual Activity  Drug Use Not Currently   Types: Cocaine, Marijuana    Social History   Socioeconomic History   Marital status: Single    Spouse name: Not on file   Number of children: Not on file   Years of education: Not on file   Highest education level: Not on file  Occupational History   Not on file  Tobacco Use   Smoking status: Some Days    Packs/day: 0.20    Years: 40.00    Pack years: 8.00    Types: Cigarettes   Smokeless tobacco: Never  Vaping Use   Vaping Use: Every day   Start date: 04/07/2020   Substances: Nicotine, Flavoring  Substance and Sexual Activity   Alcohol use: Not Currently   Drug use: Not Currently    Types: Cocaine, Marijuana   Sexual activity: Not on file  Other Topics Concern   Not on file  Social History Narrative   Not on file   Social Determinants of Health   Financial Resource Strain: Not on file  Food Insecurity: Not on file  Transportation Needs: Not on file  Physical Activity: Not on  file  Stress: Not on file  Social Connections: Not on file   Additional Social History:     Sleep: Good  Appetite:  Fair  Current Medications: Current Facility-Administered Medications  Medication Dose Route Frequency Provider Last Rate Last Admin   acetaminophen (TYLENOL) tablet 650 mg  650 mg Oral Q6H PRN Laveda Abbe, NP       hydrOXYzine (ATARAX/VISTARIL) tablet 25 mg  25 mg Oral TID PRN Dahlia Byes C, NP   25 mg at 02/17/21 0837   ibuprofen (ADVIL) tablet 600 mg  600 mg  Oral Q6H PRN Laveda Abbe, NP   600 mg at 02/16/21 1833   OLANZapine zydis (ZYPREXA) disintegrating tablet 5 mg  5 mg Oral Q8H PRN Laveda Abbe, NP   5 mg at 02/17/21 1025   And   LORazepam (ATIVAN) tablet 1 mg  1 mg Oral PRN Laveda Abbe, NP       And   ziprasidone (GEODON) injection 20 mg  20 mg Intramuscular PRN Laveda Abbe, NP       melatonin tablet 5 mg  5 mg Oral QHS Dahlia Byes C, NP   5 mg at 02/17/21 2103   multivitamin with minerals tablet 1 tablet  1 tablet Oral Daily Massengill, Harrold Donath, MD   1 tablet at 02/18/21 0816   OLANZapine zydis (ZYPREXA) disintegrating tablet 15 mg  15 mg Oral QHS Carlyn Reichert, MD   15 mg at 02/17/21 2102   And   OLANZapine zydis (ZYPREXA) disintegrating tablet 5 mg  5 mg Oral Daily Carlyn Reichert, MD   5 mg at 02/18/21 0816   polyethylene glycol (MIRALAX / GLYCOLAX) packet 17 g  17 g Oral Daily PRN Laveda Abbe, NP       prazosin (MINIPRESS) capsule 1 mg  1 mg Oral QHS Laveda Abbe, NP   1 mg at 02/17/21 2102   propranolol (INDERAL) tablet 10 mg  10 mg Oral TID Phineas Inches, MD   10 mg at 02/18/21 1340   senna (SENOKOT) tablet 8.6 mg  1 tablet Oral Daily PRN Laveda Abbe, NP       sertraline (ZOLOFT) tablet 150 mg  150 mg Oral Daily Laveda Abbe, NP   150 mg at 02/18/21 7893   thiamine tablet 100 mg  100 mg Oral Daily Massengill, Harrold Donath, MD   100 mg at 02/18/21 8101    Lab Results:  No results found for this or any previous visit (from the past 48 hour(s)).   Blood Alcohol level:  Lab Results  Component Value Date   ETH <10 02/08/2021   ETH <10 07/13/2020    Metabolic Disorder Labs: Lab Results  Component Value Date   HGBA1C 5.7 (H) 02/08/2021   MPG 116.89 02/08/2021   No results found for: PROLACTIN Lab Results  Component Value Date   CHOL 222 (H) 02/11/2021   TRIG 200 (H) 02/11/2021   HDL 41 02/11/2021   CHOLHDL 5.4 02/11/2021   VLDL 40 02/11/2021    LDLCALC 141 (H) 02/11/2021   LDLCALC 187 (H) 02/08/2021    Physical Findings: AIMS: not yet assessed CIWA:  CIWA-Ar Total: 2  Musculoskeletal: Strength & Muscle Tone: within normal limits Gait & Station: normal Patient leans: N/A  Psychiatric Specialty Exam:  Presentation  General Appearance: Casual  Eye Contact:Fair  Speech:Clear and Coherent  Speech Volume:Increased  Handedness:Right   Mood and Affect  Mood: "hopeless"  Affect:Teaful, labile, dysphoric  Thought Processes:  ruminative and concrete  Orientation:uncooperative for questioning  Thought Content: fixated on her inability to improve her depression and anxiety  History of Schizophrenia/Schizoaffective disorder:No  Duration of Psychotic Symptoms:N/A  Hallucinations: none reported  Ideas of Reference:None  Suicidal Thoughts: passive suicidal thoughts  Homicidal Thoughts: none reported   Sensorium  Memory:Immediate Fair; Recent Fair; Remote Fair  Judgment:Poor  Insight:Poor   Executive Functions  Concentration:Fair  Attention Span:Fair  Recall:untested  Fund of Knowledge:Fair  Language:Good   Psychomotor Activity  Psychomotor Activity: normal   Assets  Assets:Communication Skills; Financial Resources/Insurance; Resilience   Sleep  Sleep: 7.25 hours  Physical Exam Vitals and nursing note reviewed.  HENT:     Head: Normocephalic.  Pulmonary:     Effort: Pulmonary effort is normal.  Neurological:     General: No focal deficit present.     Mental Status: She is alert.   ROS: not assessed on this encounter Blood pressure 111/81, pulse 88, temperature 98.1 F (36.7 C), temperature source Oral, resp. rate 16, height 5\' 8"  (1.727 m), weight 100.7 kg, SpO2 98 %. Body mass index is 33.75 kg/m.   Treatment Plan Summary: Daily contact with patient to assess and evaluate symptoms and progress in treatment and Medication management.    MDD, recurrent severe with  psychotic features:  -Continue Zyprexa zydis 5 mg AM / 15 mg qhs -Patient has rejected metformin -Consider transition to LAI given past non-compliance -Continue Zoloft 150 mg daily, for depression and obsessive thoughts (patient scored 8/16 on YBOCS obssessive thoughts portion, will continue to explore this with her) -Continue propranolol 10 mg 3 times daily, for anxiety   Anxiety:  -Continue Hydroxyzine 25 mg po tid as needed for anxiety   Insomnia:  -Continue Melatonin 5 mg po at bed time for sleep.    PTSD -Continue Minipress 1 mg po Q hs.   Agitation protocol:  Geodon 20 mg IM as needed for agitation Ativan 1 mg PO as needed for anxiety Zyprexa Zydis 5 mg PO every 8 hours as needed for agitation.   Continue other prn medications as ordered for other medical complaints.   Continue every 15 Minute safety checks Encourage participation on the therapeutic milieu and group therapy  Discharge planning in progress   , MD 02/18/2021, 2:58 PM

## 2021-02-19 MED ORDER — OLANZAPINE 2.5 MG PO TABS
2.5000 mg | ORAL_TABLET | Freq: Every day | ORAL | Status: DC
  Administered 2021-02-20: 2.5 mg via ORAL
  Filled 2021-02-19 (×2): qty 1

## 2021-02-19 MED ORDER — OLANZAPINE 7.5 MG PO TABS
7.5000 mg | ORAL_TABLET | Freq: Every day | ORAL | Status: DC
  Administered 2021-02-19: 7.5 mg via ORAL
  Filled 2021-02-19 (×2): qty 1

## 2021-02-19 MED ORDER — SERTRALINE HCL 50 MG PO TABS
175.0000 mg | ORAL_TABLET | Freq: Every day | ORAL | Status: DC
  Administered 2021-02-20 – 2021-02-21 (×2): 175 mg via ORAL
  Filled 2021-02-19 (×3): qty 3.5

## 2021-02-19 NOTE — Progress Notes (Signed)
Pt was encouraged but didn't attend psycho-ed group.  

## 2021-02-19 NOTE — Progress Notes (Signed)
Progress note  Pt found in bed; compliant with medication administration. Pt continues to be sad, sullen, guarded, and minimal. Pt has poor eye contact. Pt asked for "no more questions" during their morning assessment. Pt has been seen in the dayroom but not interacting with peers. Pt denies si/hi/ah/vh and verbally agrees to approach staff if these become apparent or before harming themselves/others while at [facility].  A: Pt provided support and encouragement. Pt given medication per protocol and standing orders. Q93m safety checks implemented and continued.  R: Pt safe on the unit. Will continue to monitor.

## 2021-02-19 NOTE — [facility] Suicide Risk Assessment (Signed)
[facility] INPATIENT:  Family/Significant Other Suicide Prevention Education  Suicide Prevention Education:  Education Completed; Darnelle Maffucci [phone] (Friend) has been identified by the patient as the family member/significant other with whom the patient will be residing, and identified as the person(s) who will aid the patient in the event of a mental health crisis (suicidal ideations/suicide attempt).  With written consent from the patient, the family member/significant other has been provided the following suicide prevention education, prior to the and/or following the discharge of the patient.  The suicide prevention education provided includes the following: Suicide risk factors Suicide prevention and interventions National Suicide Hotline telephone number [facility] assessment telephone number [facility] Emergency Assistance 911 [facility] and/or Residential Mobile Crisis Unit telephone number  Request made of family/significant other to: Remove weapons (e.g., guns, rifles, knives), all items previously/currently identified as safety concern.   Remove drugs/medications (over-the-counter, prescriptions, illicit drugs), all items previously/currently identified as a safety concern.  The family member/significant other verbalizes understanding of the suicide prevention education information provided.  The family member/significant other agrees to remove the items of safety concern listed above.  CSW spoke with Ms. Homero Fellers who states that her friend's belongings are still at the [facility].  She also states that her friend can return to the [facility].  She states that she is the Careers adviser and that there are only 2 other house members at this time. Ms. Homero Fellers states that they would have to vote to have her removed from the home and they have all decided that she can return to the home.  She states that they feel this is a medical reason and that she  should not be kicked out over a medical reason.  She also states that if her friend's medications are sent to the [facility] on [address] she can take her to get her medications each month when she goes to get her own.  Ms. Homero Fellers states that there are no firearms or weapons in the home.  CSW completed SPE with Ms. Homero Fellers.   [place] K[employer] Holly Iannaccone 02/19/2021, 11:12 AM

## 2021-02-19 NOTE — Group Note (Signed)
Recreation Therapy Group Note   Group Topic:Other  Group Date: 02/19/2021 Start Time: 1000 End Time: 1050 Facilitators: Caroll Rancher, LRT,CTRS Location: 500 Hall Dayroom   Goal Area(s) Addresses: Patient will successfully practice self-awareness and reflect on current values, lifestyle, and habits.   Patient will identify how skills learned during activity can be used to reach post d/c goals and make healthy changes.     Group Description: My DBT House. LRT and patients held a group discussion on behavioral expectations and group topic promoting self-awareness and reflection. Writer drew a diagram of a house and used interactive methods to incorporate patients in the labelling process, allowing for open response and teach back to ensure understanding. Patients were given their own sheet to label as the group shared ideas.  Sections and labels included:        Foundation- Values that govern their life       Walls- People and things that support them through the day to day       Door- Things they hide from others        Basement- Behaviors they are trying to gain control of or areas of their life they want to change       1st Floor- Emotions they want to experience more often, more fully, or in a healthier way       2nd Floor- List of all the things they are happy about or want to feel happy about       3rd Floor/Attic- List of what a "life worth living" would look like for them       Roof- People or factors that protect them       [place]imney- Challenging emotions and triggers they experience       Smoke- Ways they "blow off steam"      Yard Sign- Things they are proud of and want others to see       Sunshine- What brings them joy  Patients were instructed to complete this with realistic answers, not filtering responses. Patients were offered debriefing on the activity and encouraged to speak on areas they like about what they listed and what they want to see change within their  diagram post discharge.    Affect/Mood: N/A   Participation Level: Did not attend    Clinical Observations/Individualized Feedback: Pt did not attend group.    Plan: Continue to engage patient in RT group sessions 2-3x/week.   Caroll Rancher, Antonietta Jewel  02/19/2021 12:33 PM
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
## 2021-02-19 NOTE — Plan of Care (Signed)
  Problem: Education: Goal: Knowledge of [place] [facility] Education information/materials will improve Outcome: Progressing   Problem: Activity: Goal: Interest or engagement in activities will improve Outcome: Progressing   Problem: Coping: Goal: Ability to verbalize frustrations and anger appropriately will improve Outcome: Progressing   

## 2021-02-19 NOTE — Progress Notes (Signed)
Adult Psychoeducational Group Note  Date:  02/19/2021 Time:  10:44 PM  Group Topic/Focus:  Wrap-Up Group:   The focus of this group is to help patients review their daily goal of treatment and discuss progress on daily workbooks.  Participation Level:  Did Not Attend  Participation Quality:   Did Not Attend  Affect:   Did Not Attend  Cognitive:   Did Not Attend  Insight: None  Engagement in Group:   Did Not Attend  Modes of Intervention:   Did Not Attend  Additional Comments:  Pt did not attend evening wrap up group tonight.  Felipa Furnace 02/19/2021, 10:44 PM

## 2021-02-19 NOTE — Progress Notes (Signed)
Pt was encouraged but didn't attend orientation/goals group. ?

## 2021-02-19 NOTE — Progress Notes (Signed)
[facility] MD Progress Note  02/19/2021 1:40 PM Anna [name]  MRN:  [id number] Subjective:  Patient is a 50 year old female admitted under IVC from [facility] for MDD with Psychotic features with known psychiatric hx of MDD recurrent severe without psychosis, alcohol use disorder with dependence, borderline personality disorder, cannabis use disorder, cocaine use disorder, GAD, PTSD, and suicide attempt.  The patient's chart was reviewed and nursing notes were reviewed. Over the past 24 hrs, the patient was received no PRN medications for agitation. The patient's case was discussed in multidisciplinary team meeting.   On evaluation today, the patient is dysphoric and argumentative.  She continues to appear less anxious and confused. She reports that her mood is terrible today.  She looks down throughout the interview and almost never makes eye contact.  She reports frustration over people judging her and says that she "wants to be alone on a [facility]".  She wonders aloud if this makes her "antisocial".  She reports feeling like other patients are talking about her in a negative way. Later she discloses that she is allowed back at her [place] house.  However, she reports that people playing games with her there.  She is unable to elaborate on this.  She reports suicidal thoughts with no particular plan.   Principal Problem: Severe recurrent major depressive disorder with psychotic features (HCC) Diagnosis: Principal Problem:   Severe recurrent major depressive disorder with psychotic features (HCC) Active Problems:   Borderline personality disorder (HCC)   Nicotine dependence with current use   GAD (generalized anxiety disorder)  Total Time spent with patient: 30 minutes  Past Psychiatric History: See H&P  Past Medical History:  Past Medical History:  Diagnosis Date   Anxiety    Borderline personality disorder in adult [facility])    Hearing deficit 07/13/2020   MDD (major depressive  disorder)    History reviewed. No pertinent surgical history. Family History: History reviewed. No pertinent family history. Family Psychiatric  History: See H&P Social History:  Social History   Substance and Sexual Activity  Alcohol Use Not Currently     Social History   Substance and Sexual Activity  Drug Use Not Currently   Types: Cocaine, Marijuana    Social History   Socioeconomic History   Marital status: Single    Spouse name: Not on file   Number of children: Not on file   Years of education: Not on file   Highest education level: Not on file  Occupational History   Not on file  Tobacco Use   Smoking status: Some Days    Packs/day: 0.20    Years: 40.00    Pack years: 8.00    Types: Cigarettes   Smokeless tobacco: Never  Vaping Use   Vaping Use: Every day   Start date: 04/07/2020   Substances: Nicotine, Flavoring  Substance and Sexual Activity   Alcohol use: Not Currently   Drug use: Not Currently    Types: Cocaine, Marijuana   Sexual activity: Not on file  Other Topics Concern   Not on file  Social History Narrative   Not on file   Social Determinants of Health   Financial Resource Strain: Not on file  Food Insecurity: Not on file  Transportation Needs: Not on file  Physical Activity: Not on file  Stress: Not on file  Social Connections: Not on file   Additional Social History:     Sleep: Good  Appetite:  Fair  Current Medications: Current  Facility-Administered Medications  Medication Dose Route Frequency Provider Last Rate Last Admin   acetaminophen (TYLENOL) tablet 650 mg  650 mg Oral Q6H PRN Laveda Abbe, NP       hydrOXYzine (ATARAX/VISTARIL) tablet 25 mg  25 mg Oral TID PRN Dahlia Byes C, NP   25 mg at 02/17/21 0837   ibuprofen (ADVIL) tablet 600 mg  600 mg Oral Q6H PRN Laveda Abbe, NP   600 mg at 02/18/21 2041   OLANZapine zydis (ZYPREXA) disintegrating tablet 5 mg  5 mg Oral Q8H PRN Laveda Abbe, NP    5 mg at 02/17/21 1025   And   LORazepam (ATIVAN) tablet 1 mg  1 mg Oral PRN Laveda Abbe, NP       And   ziprasidone (GEODON) injection 20 mg  20 mg Intramuscular PRN Laveda Abbe, NP       melatonin tablet 5 mg  5 mg Oral QHS Dahlia Byes C, NP   5 mg at 02/18/21 2041   multivitamin with minerals tablet 1 tablet  1 tablet Oral Daily Massengill, Harrold Donath, MD   1 tablet at 02/19/21 0801   [START ON 02/20/2021] OLANZapine zydis (ZYPREXA) disintegrating tablet 2.5 mg  2.5 mg Oral Daily Carlyn Reichert, MD       And   OLANZapine zydis (ZYPREXA) disintegrating tablet 7.5 mg  7.5 mg Oral QHS Carlyn Reichert, MD       polyethylene glycol (MIRALAX / GLYCOLAX) packet 17 g  17 g Oral Daily PRN Laveda Abbe, NP       prazosin (MINIPRESS) capsule 1 mg  1 mg Oral QHS Laveda Abbe, NP   1 mg at 02/18/21 2041   propranolol (INDERAL) tablet 10 mg  10 mg Oral TID Phineas Inches, MD   10 mg at 02/19/21 0801   senna (SENOKOT) tablet 8.6 mg  1 tablet Oral Daily PRN Laveda Abbe, NP       Melene Muller ON 02/20/2021] sertraline (ZOLOFT) tablet 175 mg  175 mg Oral Daily Carlyn Reichert, MD       thiamine tablet 100 mg  100 mg Oral Daily Massengill, Harrold Donath, MD   100 mg at 02/19/21 4268    Lab Results:  No results found for this or any previous visit (from the past 48 hour(s)).   Blood Alcohol level:  Lab Results  Component Value Date   ETH <10 02/08/2021   ETH <10 07/13/2020    Metabolic Disorder Labs: Lab Results  Component Value Date   HGBA1C 5.7 (H) 02/08/2021   MPG 116.89 02/08/2021   No results found for: PROLACTIN Lab Results  Component Value Date   CHOL 222 (H) 02/11/2021   TRIG 200 (H) 02/11/2021   HDL 41 02/11/2021   CHOLHDL 5.4 02/11/2021   VLDL 40 02/11/2021   LDLCALC 141 (H) 02/11/2021   LDLCALC 187 (H) 02/08/2021    Physical Findings: AIMS: not yet assessed CIWA:  CIWA-Ar Total: 2  Musculoskeletal: Strength & Muscle Tone: within  normal limits Gait & Station: normal Patient leans: N/A  Psychiatric Specialty Exam:  Presentation  General Appearance: Casual  Eye Contact: poor Speech:Clear and Coherent  Speech Volume:Increased  Handedness:Right   Mood and Affect  Mood: "terrible"  Affect: dysphoric  Thought Processes: ruminative and concrete  Orientation: full  Thought Content: fixated on her inability to improve her depression and anxiety, suicidal thoughts without plan  History of Schizophrenia/Schizoaffective disorder:No  Duration of Psychotic Symptoms:N/A  Hallucinations: none  reported  Ideas of Reference:None  Suicidal Thoughts: suicidal thoughts without plan  Homicidal Thoughts: none reported   Sensorium  Memory:Immediate Fair; Recent Fair; Remote Fair  Judgment:Poor  Insight:Poor   Executive Functions  Concentration:Fair  Attention Span:Fair  Recall:untested  Fund of Knowledge:Fair  Language:Good   Psychomotor Activity  Psychomotor Activity: normal   Assets  Assets:Communication Skills; Financial Resources/Insurance; Resilience   Sleep  Sleep: fair  Physical Exam Vitals and nursing note reviewed.  HENT:     Head: Normocephalic.  Pulmonary:     Effort: Pulmonary effort is normal.  Neurological:     General: No focal deficit present.     Mental Status: She is alert.   ROS: not assessed on this encounter Blood pressure 117/71, pulse 67, temperature 97.7 F (36.5 C), temperature source Oral, resp. rate 16, height 5\' 8"  (1.727 m), weight 100.7 kg, SpO2 98 %. Body mass index is 33.75 kg/m.   Treatment Plan Summary: Daily contact with patient to assess and evaluate symptoms and progress in treatment and Medication management.  MDD, recurrent severe with psychotic features:  The patient continues to appear less anxious and confused.  Her thoughts regarding other patients and people at her house could be considered paranoid, however, it does not seem  that these thoughts are psychotic in nature. Her primary problem at present is depression. -Decrease Zyprexa to 2.5 mg AM / 7.5 mg QHS given resolving psychosis -Increase Zoloft to 175 mg daily, for depression -Continue to consider augmentation strategy: Abilify, Seroquel, Cytomel, lithium and others -Continue propranolol 10 mg 3 times daily, for anxiety   Anxiety:  -Continue Hydroxyzine 25 mg po tid as needed for anxiety   Insomnia:  -Continue Melatonin 5 mg po at bed time for sleep.    PTSD -Continue Minipress 1 mg po Q hs.   Agitation protocol:  Geodon 20 mg IM as needed for agitation Ativan 1 mg PO as needed for anxiety Zyprexa Zydis 5 mg PO every 8 hours as needed for agitation.   Continue other prn medications as ordered for other medical complaints.   Continue every 15 Minute safety checks Encourage participation on the therapeutic milieu and group therapy  Discharge planning in progress   Purcell Nails, MD 02/19/2021, 1:40 PM

## 2021-02-19 NOTE — Progress Notes (Signed)
   02/19/21 0617  Vital Signs  Pulse Rate 91  BP (!) 83/68  BP Location Right Arm  BP Method Automatic  Patient Position (if appropriate) Standing   Pt said she felt a little dizzy. Went back to her room. Refused assistance. Pt in the bathroom. Took a cup of Gatorade to pt to drink to boost blood pressure.

## 2021-02-20 ENCOUNTER — Encounter (HOSPITAL_COMMUNITY): Payer: Self-pay

## 2021-02-20 MED ORDER — OLANZAPINE 5 MG PO TABS
5.0000 mg | ORAL_TABLET | Freq: Every day | ORAL | Status: DC
  Administered 2021-02-20: 5 mg via ORAL
  Filled 2021-02-20 (×2): qty 1

## 2021-02-20 MED ORDER — OLANZAPINE 2.5 MG PO TABS
2.5000 mg | ORAL_TABLET | Freq: Every day | ORAL | Status: DC
  Administered 2021-02-21: 08:00:00 2.5 mg via ORAL
  Filled 2021-02-20 (×2): qty 1

## 2021-02-20 NOTE — [facility] Group Notes (Signed)
Did not attend psychoeducational group 

## 2021-02-20 NOTE — Progress Notes (Signed)
Pt blood pressure 98/74. Pt given Prazosin but help Propanolol until BP can be assessed again. Offered pt Gatorade and a reevaluation in 15 minutes. "I don't want that. I think the Inderal is working but I don't want to go through all that tonight. I refuse the Inderal. I just want to go back to my room."

## 2021-02-20 NOTE — Progress Notes (Signed)
   02/20/21 2200  Psych Admission Type (Psych Patients Only)  Admission Status Involuntary  Psychosocial Assessment  Patient Complaints Anxiety;Irritability  Eye Contact Avertive;Brief  Facial Expression Anxious;Sad  Affect Anxious;Apathetic;Irritable  Speech Argumentative;Logical/coherent  Interaction Arrogant;Attention-seeking;Childlike  Motor Activity Slow  Appearance/Hygiene Disheveled  Behavior Characteristics Cooperative  Mood Labile  Thought Process  Coherency Concrete thinking  Content Ambivalence  Delusions None reported or observed  Perception WDL  Hallucination None reported or observed  Judgment Poor  Danger to Self  Current suicidal ideation? Denies  Self-Injurious Behavior No self-injurious ideation or behavior indicators observed or expressed   Danger to Others  Danger to Others None reported or observed

## 2021-02-20 NOTE — BH IP Treatment Plan (Signed)
Interdisciplinary Treatment and Diagnostic Plan Update  02/20/2021 Time of Session: 9:50am  Anna [name] MRN: [id number]  Principal Diagnosis: Severe recurrent major depressive disorder with psychotic features [facility])  Secondary Diagnoses: Principal Problem:   Severe recurrent major depressive disorder with psychotic features (HCC) Active Problems:   Borderline personality disorder (HCC)   Nicotine dependence with current use   GAD (generalized anxiety disorder)   Current Medications:  Current Facility-Administered Medications  Medication Dose Route Frequency Provider Last Rate Last Admin   acetaminophen (TYLENOL) tablet 650 mg  650 mg Oral Q6H PRN Laveda Abbe, NP   650 mg at 02/19/21 2103   hydrOXYzine (ATARAX/VISTARIL) tablet 25 mg  25 mg Oral TID PRN Dahlia Byes C, NP   25 mg at 02/19/21 2103   ibuprofen (ADVIL) tablet 600 mg  600 mg Oral Q6H PRN Laveda Abbe, NP   600 mg at 02/18/21 2041   OLANZapine zydis (ZYPREXA) disintegrating tablet 5 mg  5 mg Oral Q8H PRN Laveda Abbe, NP   5 mg at 02/17/21 1025   And   LORazepam (ATIVAN) tablet 1 mg  1 mg Oral PRN Laveda Abbe, NP       And   ziprasidone (GEODON) injection 20 mg  20 mg Intramuscular PRN Laveda Abbe, NP       melatonin tablet 5 mg  5 mg Oral QHS Onuoha, Josephine C, NP   5 mg at 02/19/21 2103   multivitamin with minerals tablet 1 tablet  1 tablet Oral Daily Massengill, Harrold Donath, MD   1 tablet at 02/20/21 0801   OLANZapine (ZYPREXA) tablet 5 mg  5 mg Oral Seabron Spates, MD       And   Melene Muller ON 02/21/2021] OLANZapine (ZYPREXA) tablet 2.5 mg  2.5 mg Oral Daily Park Pope, MD       polyethylene glycol (MIRALAX / GLYCOLAX) packet 17 g  17 g Oral Daily PRN Laveda Abbe, NP       prazosin (MINIPRESS) capsule 1 mg  1 mg Oral QHS Laveda Abbe, NP   1 mg at 02/19/21 2103   propranolol (INDERAL) tablet 10 mg  10 mg Oral TID Phineas Inches, MD   10 mg at 02/20/21  1211   senna (SENOKOT) tablet 8.6 mg  1 tablet Oral Daily PRN Laveda Abbe, NP       sertraline (ZOLOFT) tablet 175 mg  175 mg Oral Daily Carlyn Reichert, MD   175 mg at 02/20/21 0801   thiamine tablet 100 mg  100 mg Oral Daily Massengill, Harrold Donath, MD   100 mg at 02/20/21 0801   PTA Medications: Medications Prior to Admission  Medication Sig Dispense Refill Last Dose   brexpiprazole (REXULTI) 1 MG TABS tablet Take by mouth daily. (Patient not taking: Reported on 02/09/2021)   Not Taking   buPROPion (WELLBUTRIN XL) 150 MG 24 hr tablet Take 150 mg by mouth daily. (Patient not taking: Reported on 02/09/2021)   Not Taking   busPIRone (BUSPAR) 10 MG tablet Take 10 mg by mouth 3 (three) times daily. (Patient not taking: Reported on 02/09/2021)   Not Taking   citalopram (CELEXA) 40 MG tablet Take 40 mg by mouth daily. (Patient not taking: Reported on 02/09/2021)   Not Taking   desvenlafaxine (PRISTIQ) 50 MG 24 hr tablet Take 50 mg by mouth daily. (Patient not taking: Reported on 02/09/2021)   Not Taking   hydrOXYzine (VISTARIL) 25 MG capsule Take 26 mg by mouth  every 6 (six) hours as needed for anxiety. (Patient not taking: Reported on 02/09/2021)   Not Taking   LATUDA 20 MG TABS tablet Take 20 mg by mouth daily. (Patient not taking: Reported on 02/09/2021)   Not Taking   medroxyPROGESTERone (PROVERA) 10 MG tablet Take 20 mg by mouth 2 (two) times daily. (Patient not taking: Reported on 02/09/2021)   Not Taking   QUEtiapine (SEROQUEL) 200 MG tablet Take 200 mg by mouth at bedtime. (Patient not taking: Reported on 02/09/2021)   Not Taking   topiramate (TOPAMAX) 50 MG tablet Take 50 mg by mouth 2 (two) times daily. (Patient not taking: Reported on 02/09/2021)   Not Taking   traZODone (DESYREL) 150 MG tablet Take 150 mg by mouth at bedtime. (Patient not taking: Reported on 02/09/2021)   Not Taking   TRINTELLIX 20 MG TABS tablet Take 20 mg by mouth daily. (Patient not taking: Reported on 02/09/2021)   Not Taking     Patient Stressors: Marital or family conflict   Medication change or noncompliance    Patient Strengths: General fund of knowledge  Motivation for treatment/growth   Treatment Modalities: Medication Management, Group therapy, Case management,  1 to 1 session with clinician, Psychoeducation, Recreational therapy.   Physician Treatment Plan for Primary Diagnosis: Severe recurrent major depressive disorder with psychotic features (HCC) Long Term Goal(s): Improvement in symptoms so as ready for discharge   Short Term Goals: Ability to identify changes in lifestyle to reduce recurrence of condition will improve Ability to verbalize feelings will improve Ability to disclose and discuss suicidal ideas Ability to demonstrate self-control will improve Ability to identify and develop effective coping behaviors will improve Ability to maintain clinical measurements within normal limits will improve Compliance with prescribed medications will improve Ability to identify triggers associated with substance abuse/mental health issues will improve  Medication Management: Evaluate patient's response, side effects, and tolerance of medication regimen.  Therapeutic Interventions: 1 to 1 sessions, Unit Group sessions and Medication administration.  Evaluation of Outcomes: Progressing  Physician Treatment Plan for Secondary Diagnosis: Principal Problem:   Severe recurrent major depressive disorder with psychotic features (HCC) Active Problems:   Borderline personality disorder (HCC)   Nicotine dependence with current use   GAD (generalized anxiety disorder)  Long Term Goal(s): Improvement in symptoms so as ready for discharge   Short Term Goals: Ability to identify changes in lifestyle to reduce recurrence of condition will improve Ability to verbalize feelings will improve Ability to disclose and discuss suicidal ideas Ability to demonstrate self-control will improve Ability to identify  and develop effective coping behaviors will improve Ability to maintain clinical measurements within normal limits will improve Compliance with prescribed medications will improve Ability to identify triggers associated with substance abuse/mental health issues will improve     Medication Management: Evaluate patient's response, side effects, and tolerance of medication regimen.  Therapeutic Interventions: 1 to 1 sessions, Unit Group sessions and Medication administration.  Evaluation of Outcomes: Progressing   RN Treatment Plan for Primary Diagnosis: Severe recurrent major depressive disorder with psychotic features (HCC) Long Term Goal(s): Knowledge of disease and therapeutic regimen to maintain health will improve  Short Term Goals: Ability to remain free from injury will improve, Ability to demonstrate self-control, Ability to participate in decision making will improve, Ability to verbalize feelings will improve, Ability to disclose and discuss suicidal ideas, and Ability to identify and develop effective coping behaviors will improve  Medication Management: RN will administer medications as ordered by provider,  will assess and evaluate patient's response and provide education to patient for prescribed medication. RN will report any adverse and/or side effects to prescribing provider.  Therapeutic Interventions: 1 on 1 counseling sessions, Psychoeducation, Medication administration, Evaluate responses to treatment, Monitor vital signs and CBGs as ordered, Perform/monitor CIWA, COWS, AIMS and Fall Risk screenings as ordered, Perform wound care treatments as ordered.  Evaluation of Outcomes: Progressing   LCSW Treatment Plan for Primary Diagnosis: Severe recurrent major depressive disorder with psychotic features (HCC) Long Term Goal(s): Safe transition to appropriate next level of care at discharge, Engage patient in therapeutic group addressing interpersonal concerns.  Short Term  Goals: Engage patient in aftercare planning with referrals and resources, Increase social support, Increase emotional regulation, Facilitate acceptance of mental health diagnosis and concerns, Identify triggers associated with mental health/substance abuse issues, and Increase skills for wellness and recovery  Therapeutic Interventions: Assess for all discharge needs, 1 to 1 time with Social worker, Explore available resources and support systems, Assess for adequacy in community support network, Educate family and significant other(s) on suicide prevention, Complete Psychosocial Assessment, Interpersonal group therapy.  Evaluation of Outcomes: Progressing   Progress in Treatment: Attending groups: Yes. Participating in groups: Yes. Taking medication as prescribed: Yes. Toleration medication: Yes. Family/Significant other contact made: Yes, individual(s) contacted:  Friend  Patient understands diagnosis: No. Discussing patient identified problems/goals with staff: Yes. Medical problems stabilized or resolved: Yes. Denies suicidal/homicidal ideation: Yes. Issues/concerns per patient self-inventory: No.   New problem(s) identified: No, Describe:  None   New Short Term/Long Term Goal(s): medication stabilization, elimination of SI thoughts, development of comprehensive mental wellness plan.   Patient Goals: "To try and get my depressive symptoms under control"   Discharge Plan or Barriers: Return to the [facility] and follow up with [facility] for therapy and medication management   Reason for Continuation of Hospitalization: Anxiety  Estimated Length of Stay: 3 to 5 days    Scribe for Treatment Team: Aram Beecham, Theresia Majors 02/20/2021 3:24 PM

## 2021-02-20 NOTE — Group Note (Signed)
LCSW Group Therapy Note  02/20/2021    1:00 - 2:00 PM               Type of Therapy and Topic:  Group Therapy: Understanding the differences between fear and anxiety / Fear Ladder & Examining the Evidence  Participation Level:  Minimal   Description of Group:   In this group session, patients learned how to define and recognize the similarities differences between fear and anxiety. Patients will explore reactions we have to fear and anxiety. Patients identified a fear or something they feel anxious about. Patients analyzed scenarios and discussed both the positive and negative aspects to them. Patients were asked to identify if it is a fear or anxiety as well.  Patients were asked to provide their own examples. Patients will learn what to do for both fear and anxiety. CSW provided tools such as breaking things up into smaller steps, challenging automatic negative thinking / questions to ask, fear ladder and how to use gradual exposure. Patients will be asked to practice each tool with situations and to complete a fear ladder for their personal gain.   Therapeutic Goals: Patients will learn the difference between fear versus anxiety and the definitions of both. Patients will utilize scenarios and be able to identify if this is an example of a fear or anxiety. Patients will learn tools to handle both their fears and anxieties as well as discuss breaking it into smaller steps and exposure.  Patients will be asked to provide examples of their own and discuss how things have both positive and negative aspects.  Patients were provided questions to ask to challenge automatic negative thinking for anxiety.  Patients were provided a sample form of a fear ladder and asked to complete one for a fear.   Summary of Patient Progress: Patient attended group but did not participate.    Therapeutic Modalities:   Cognitive Behavioral Therapy Motivational Interviewing  Brief Therapy  Gardiner Sleeper Marieliz Strang,  LCSW  02/20/2021 2:12 PM

## 2021-02-20 NOTE — Progress Notes (Signed)
Progress note    02/20/21 0800  Psych Admission Type (Psych Patients Only)  Admission Status Involuntary  Psychosocial Assessment  Patient Complaints Anxiety;Irritability  Eye Contact Avertive;Brief  Facial Expression Anxious;Sad  Affect Anxious;Apathetic;Irritable  Speech Argumentative;Logical/coherent  Interaction Arrogant;Attention-seeking;Childlike  Motor Activity Slow  Appearance/Hygiene Disheveled  Behavior Characteristics Cooperative;Agressive verbally;Agitated;Anxious;Irritable  Mood Labile;Ambivalent;Irritable  Thought Process  Coherency Concrete thinking  Content Ambivalence  Delusions None reported or observed  Perception WDL  Hallucination None reported or observed  Judgment Poor  Danger to Self  Current suicidal ideation? Denies  Self-Injurious Behavior No self-injurious ideation or behavior indicators observed or expressed   Danger to Others  Danger to Others None reported or observed

## 2021-02-20 NOTE — Plan of Care (Signed)
  Problem: Activity: Goal: Sleeping patterns will improve Outcome: Progressing   Problem: Coping: Goal: Ability to demonstrate self-control will improve Outcome: Progressing   Problem: Health Behavior/Discharge Planning: Goal: Compliance with treatment plan for underlying cause of condition will improve Outcome: Progressing   

## 2021-02-20 NOTE — Progress Notes (Signed)
Pt asleep at dinner. Medications moved to bedtime.

## 2021-02-20 NOTE — Progress Notes (Signed)
[facility] MD Progress Note  02/20/2021 11:06 AM Anna [name]  MRN:  [id number] Subjective:  Patient is a 50 year old female admitted under IVC from [facility] for MDD with Psychotic features with known psychiatric hx of MDD recurrent severe without psychosis, alcohol use disorder with dependence, borderline personality disorder, cannabis use disorder, cocaine use disorder, GAD, PTSD, and suicide attempt.  Chart Review, 24 hr Events: The patient's chart was reviewed and nursing notes were reviewed. The patient's case was discussed in multidisciplinary team meeting.  Per MAR: - Patient is generally compliant with scheduled meds. Patient refused inderal last night due to low BP and unwilling to drink gatorade for it - PRNs: tylenol, hydroxyzine Per RN notes, no documented behavioral issues and is not attending group. Patient slept, 6.75 hours  Patient seen and assessed with attending Dr. Sherron Flemings.  Patient was dysphoric and agitated.  She does not appear confused.  She does appear anxious today.  Patient made very little eye contact with [place] this morning.  Patient reports frustration over people judging her stating that "people are talking around me and I wish they would just say what they want".  Patient also reports that she would be "better off if I was permanently dead" and that "that is the way things will be until I take my life and it is my choice".  Patient insists that this is nothing but a game.  Patient reports suicidal thoughts with no plan.    Principal Problem: Severe recurrent major depressive disorder with psychotic features (HCC) Diagnosis: Principal Problem:   Severe recurrent major depressive disorder with psychotic features (HCC) Active Problems:   Borderline personality disorder (HCC)   Nicotine dependence with current use   GAD (generalized anxiety disorder)  Total Time spent with patient: 30 minutes  Past Psychiatric History: See H&P  Past Medical History:  Past  Medical History:  Diagnosis Date   Anxiety    Borderline personality disorder in adult [facility])    Hearing deficit 07/13/2020   MDD (major depressive disorder)    History reviewed. No pertinent surgical history. Family History: History reviewed. No pertinent family history. Family Psychiatric  History: See H&P Social History:  Social History   Substance and Sexual Activity  Alcohol Use Not Currently     Social History   Substance and Sexual Activity  Drug Use Not Currently   Types: Cocaine, Marijuana    Social History   Socioeconomic History   Marital status: Single    Spouse name: Not on file   Number of children: Not on file   Years of education: Not on file   Highest education level: Not on file  Occupational History   Not on file  Tobacco Use   Smoking status: Some Days    Packs/day: 0.20    Years: 40.00    Pack years: 8.00    Types: Cigarettes   Smokeless tobacco: Never  Vaping Use   Vaping Use: Every day   Start date: 04/07/2020   Substances: Nicotine, Flavoring  Substance and Sexual Activity   Alcohol use: Not Currently   Drug use: Not Currently    Types: Cocaine, Marijuana   Sexual activity: Not on file  Other Topics Concern   Not on file  Social History Narrative   Not on file   Social Determinants of Health   Financial Resource Strain: Not on file  Food Insecurity: Not on file  Transportation Needs: Not on file  Physical Activity: Not on file  Stress: Not on file  Social Connections: Not on file   Additional Social History:     Sleep: fair  Appetite:  Fair  Current Medications: Current Facility-Administered Medications  Medication Dose Route Frequency Provider Last Rate Last Admin   acetaminophen (TYLENOL) tablet 650 mg  650 mg Oral Q6H PRN Laveda Abbe, NP   650 mg at 02/19/21 2103   hydrOXYzine (ATARAX/VISTARIL) tablet 25 mg  25 mg Oral TID PRN Dahlia Byes C, NP   25 mg at 02/19/21 2103   ibuprofen (ADVIL) tablet 600 mg   600 mg Oral Q6H PRN Laveda Abbe, NP   600 mg at 02/18/21 2041   OLANZapine zydis (ZYPREXA) disintegrating tablet 5 mg  5 mg Oral Q8H PRN Laveda Abbe, NP   5 mg at 02/17/21 1025   And   LORazepam (ATIVAN) tablet 1 mg  1 mg Oral PRN Laveda Abbe, NP       And   ziprasidone (GEODON) injection 20 mg  20 mg Intramuscular PRN Laveda Abbe, NP       melatonin tablet 5 mg  5 mg Oral QHS Dahlia Byes C, NP   5 mg at 02/19/21 2103   multivitamin with minerals tablet 1 tablet  1 tablet Oral Daily Massengill, Harrold Donath, MD   1 tablet at 02/20/21 0801   OLANZapine (ZYPREXA) tablet 5 mg  5 mg Oral Seabron Spates, MD       And   Melene Muller ON 02/21/2021] OLANZapine (ZYPREXA) tablet 2.5 mg  2.5 mg Oral Daily Park Pope, MD       polyethylene glycol (MIRALAX / GLYCOLAX) packet 17 g  17 g Oral Daily PRN Laveda Abbe, NP       prazosin (MINIPRESS) capsule 1 mg  1 mg Oral QHS Laveda Abbe, NP   1 mg at 02/19/21 2103   propranolol (INDERAL) tablet 10 mg  10 mg Oral TID Phineas Inches, MD   10 mg at 02/19/21 1358   senna (SENOKOT) tablet 8.6 mg  1 tablet Oral Daily PRN Laveda Abbe, NP       sertraline (ZOLOFT) tablet 175 mg  175 mg Oral Daily Carlyn Reichert, MD   175 mg at 02/20/21 0801   thiamine tablet 100 mg  100 mg Oral Daily Massengill, Harrold Donath, MD   100 mg at 02/20/21 0801    Lab Results:  No results found for this or any previous visit (from the past 48 hour(s)).   Blood Alcohol level:  Lab Results  Component Value Date   ETH <10 02/08/2021   ETH <10 07/13/2020    Metabolic Disorder Labs: Lab Results  Component Value Date   HGBA1C 5.7 (H) 02/08/2021   MPG 116.89 02/08/2021   No results found for: PROLACTIN Lab Results  Component Value Date   CHOL 222 (H) 02/11/2021   TRIG 200 (H) 02/11/2021   HDL 41 02/11/2021   CHOLHDL 5.4 02/11/2021   VLDL 40 02/11/2021   LDLCALC 141 (H) 02/11/2021   LDLCALC 187 (H) 02/08/2021     Physical Findings: AIMS: not yet assessed CIWA:  CIWA-Ar Total: 0  Musculoskeletal: Strength & Muscle Tone: within normal limits Gait & Station: normal Patient leans: N/A  Psychiatric Specialty Exam:  Presentation  General Appearance: Casual  Eye Contact: poor Speech:Clear and Coherent  Speech Volume:Increased  Handedness:Right   Mood and Affect  Mood: "aggravated"  Affect: dysphoric  Thought Processes: ruminative and concrete  Orientation: full  Thought  Content: fixated on her inability to improve her depression and anxiety, suicidal thoughts without plan  History of Schizophrenia/Schizoaffective disorder:No  Duration of Psychotic Symptoms:N/A  Hallucinations: none reported  Ideas of Reference:None  Suicidal Thoughts: suicidal thoughts without plan  Homicidal Thoughts: none reported   Sensorium  Memory:Immediate Fair; Recent Fair; Remote Fair  Judgment:Poor  Insight:Poor   Executive Functions  Concentration:Fair  Attention Span:Fair  Recall: fair  Fund of Knowledge:Fair  Language:Good   Psychomotor Activity  Psychomotor Activity: normal   Assets  Assets:Communication Skills; Financial Resources/Insurance; Resilience   Sleep  Sleep: fair  Physical Exam Vitals and nursing note reviewed.  HENT:     Head: Normocephalic.  Pulmonary:     Effort: Pulmonary effort is normal.  Neurological:     General: No focal deficit present.     Mental Status: She is alert.   ROS: not assessed on this encounter Blood pressure (!) 86/56, pulse 89, temperature 98.4 F (36.9 C), temperature source Oral, resp. rate 16, height 5\' 8"  (1.727 m), weight 100.7 kg, SpO2 98 %. Body mass index is 33.75 kg/m.   Treatment Plan Summary: Daily contact with patient to assess and evaluate symptoms and progress in treatment and Medication management.  MDD, recurrent severe with psychotic features:  The patient continues to appear less anxious and  confused.  Her thoughts regarding other patients and people at her house could be considered paranoid, however, it does not seem that these thoughts are psychotic in nature. Her primary problem at present is depression. -Decrease Zyprexa to 2.5 mg AM / 5.0 mg QHS given resolving psychosis -Continue Zoloft to 175 mg daily, for depression -Continue to consider augmentation strategy: Abilify, Seroquel, Cytomel, lithium and others -Continue propranolol 10 mg 3 times daily, for anxiety   Anxiety:  -Continue Hydroxyzine 25 mg po tid as needed for anxiety   Insomnia:  -Continue Melatonin 5 mg po at bed time for sleep.    PTSD -Continue Minipress 1 mg po qhs.   Agitation protocol:  Geodon 20 mg IM as needed for agitation Ativan 1 mg PO as needed for anxiety Zyprexa Zydis 5 mg PO every 8 hours as needed for agitation.   Continue other prn medications as ordered for other medical complaints.   Continue every 15 Minute safety checks Encourage participation on the therapeutic milieu and group therapy  Discharge planning in progress   Purcell Nails, MD 02/20/2021, 11:06 AM

## 2021-02-20 NOTE — Progress Notes (Signed)
   02/19/21 2020  Psych Admission Type (Psych Patients Only)  Admission Status Involuntary  Psychosocial Assessment  Patient Complaints Anxiety;Depression;Irritability;Sadness  Eye Contact Avertive;Brief  Facial Expression Anxious;Sullen;Sad;Worried  Affect Anxious;Depressed;Sad;Sullen;Irritable  Speech Soft  Interaction Forwards little;Guarded;Minimal  Motor Activity Slow  Appearance/Hygiene Unremarkable  Behavior Characteristics Cooperative;Anxious;Guarded  Mood Depressed;Anxious;Irritable;Pleasant  Thought Process  Coherency Concrete thinking  Content Ambivalence  Delusions None reported or observed  Perception WDL  Hallucination None reported or observed  Judgment Poor  Confusion None  Danger to Self  Current suicidal ideation? Passive  Self-Injurious Behavior No self-injurious ideation or behavior indicators observed or expressed   Agreement Not to Harm Self Yes  Description of Agreement verbal contract to approach staff  Danger to Others  Danger to Others None reported or observed   Pt seen in her room. Pt jumpy when this Clinical research associate enters room. Pt endorses passive SI without a plan but contracts for safety. Pt denies HI, AVH. Endorses chronic pain in lower back and pain in lower abdomen. Doesn't give a pain score although repeatedly asked. "I don't know." Pt shakes her head. Pt rates anxiety 8/10 and depression 10/10. "It's always like this that's why I'm here." Pt wants help with anxiety and depression.

## 2021-02-20 NOTE — [facility] Group Notes (Signed)
[facility] Group Notes:  (Nursing/MHT/Case Management/Adjunct)  Date:  02/20/2021  Time:  9:48 PM  Type of Therapy:  Psychoeducational Skills  Participation Level:  None  Participation Quality:  Resistant  Affect:  Resistant  Cognitive:  Lacking  Insight:  None  Engagement in Group:  None  Modes of Intervention:  Education  Summary of Progress/Problems: The patient did not attend group this evening.   Hazle Coca S 02/20/2021, 9:48 PM

## 2021-02-21 LAB — GLUCOSE, CAPILLARY: Glucose-Capillary: 119 mg/dL — ABNORMAL HIGH (ref 70–99)

## 2021-02-21 MED ORDER — SERTRALINE HCL 25 MG PO TABS: 25.0000 mg | ORAL_TABLET | Freq: Once | ORAL | Status: DC

## 2021-02-21 MED ORDER — SERTRALINE HCL 50 MG PO TABS: 50.0000 mg | ORAL_TABLET | Freq: Once | ORAL | Status: DC

## 2021-02-21 MED ORDER — FLUOXETINE HCL 10 MG PO CAPS
30.0000 mg | ORAL_CAPSULE | Freq: Once | ORAL | Status: AC
  Administered 2021-02-23: 30 mg via ORAL
  Filled 2021-02-21: qty 3

## 2021-02-21 MED ORDER — SERTRALINE HCL 100 MG PO TABS: 100.0000 mg | ORAL_TABLET | Freq: Once | ORAL | Status: DC

## 2021-02-21 MED ORDER — FLUOXETINE HCL 20 MG PO CAPS: 40.0000 mg | ORAL_CAPSULE | Freq: Every day | ORAL | Status: DC

## 2021-02-21 MED ORDER — OLANZAPINE 5 MG PO TBDP
5.0000 mg | ORAL_TABLET | Freq: Every day | ORAL | Status: DC
  Filled 2021-02-21: qty 1

## 2021-02-21 MED ORDER — FLUOXETINE HCL 20 MG PO CAPS: 20.0000 mg | ORAL_CAPSULE | Freq: Once | ORAL | Status: DC

## 2021-02-21 MED ORDER — FLUOXETINE HCL 20 MG PO CAPS
40.0000 mg | ORAL_CAPSULE | Freq: Every day | ORAL | Status: DC
  Administered 2021-02-24 – 2021-02-26 (×3): 40 mg via ORAL
  Filled 2021-02-21 (×5): qty 2

## 2021-02-21 MED ORDER — SERTRALINE HCL 50 MG PO TABS
50.0000 mg | ORAL_TABLET | Freq: Once | ORAL | Status: AC
  Administered 2021-02-23: 50 mg via ORAL
  Filled 2021-02-21: qty 1

## 2021-02-21 MED ORDER — SERTRALINE HCL 25 MG PO TABS
25.0000 mg | ORAL_TABLET | Freq: Once | ORAL | Status: AC
  Administered 2021-02-24: 25 mg via ORAL
  Filled 2021-02-21: qty 1

## 2021-02-21 MED ORDER — OLANZAPINE 2.5 MG PO TABS
2.5000 mg | ORAL_TABLET | Freq: Every day | ORAL | Status: DC
  Filled 2021-02-21: qty 1

## 2021-02-21 MED ORDER — FLUOXETINE HCL 20 MG PO CAPS: 30.0000 mg | ORAL_CAPSULE | Freq: Once | ORAL | Status: DC

## 2021-02-21 MED ORDER — FLUOXETINE HCL 20 MG PO CAPS
20.0000 mg | ORAL_CAPSULE | Freq: Once | ORAL | Status: AC
  Administered 2021-02-22: 20 mg via ORAL
  Filled 2021-02-21: qty 1

## 2021-02-21 MED ORDER — OLANZAPINE 7.5 MG PO TABS
7.5000 mg | ORAL_TABLET | Freq: Every day | ORAL | Status: DC
  Administered 2021-02-21: 22:00:00 7.5 mg via ORAL
  Filled 2021-02-21 (×3): qty 1

## 2021-02-21 MED ORDER — SERTRALINE HCL 100 MG PO TABS
100.0000 mg | ORAL_TABLET | Freq: Once | ORAL | Status: AC
  Administered 2021-02-22: 100 mg via ORAL
  Filled 2021-02-21: qty 1

## 2021-02-21 MED ORDER — OLANZAPINE 2.5 MG PO TABS
2.5000 mg | ORAL_TABLET | Freq: Every day | ORAL | Status: DC
  Administered 2021-02-22: 2.5 mg via ORAL
  Filled 2021-02-21 (×3): qty 1

## 2021-02-21 MED ORDER — PROPRANOLOL HCL 10 MG PO TABS
10.0000 mg | ORAL_TABLET | Freq: Two times a day (BID) | ORAL | Status: DC
  Administered 2021-02-22 – 2021-02-25 (×6): 10 mg via ORAL
  Filled 2021-02-21 (×9): qty 1

## 2021-02-21 NOTE — Progress Notes (Addendum)
[facility] MD Progress Note  02/21/2021 7:25 AM Carrie Protsman  MRN:  [id number] Subjective:  Patient is a 50 year old female admitted under IVC from [facility] for MDD with Psychotic features with known psychiatric hx of MDD recurrent severe without psychosis, alcohol use disorder with dependence, borderline personality disorder, cannabis use disorder, cocaine use disorder, GAD, PTSD, and suicide attempt.  Chart Review, 24 hr Events: The patient's chart was reviewed and nursing notes were reviewed. The patient's case was discussed in multidisciplinary team meeting.  Per MAR: - Patient is generally compliant with scheduled meds.  - PRNs for agitation: none Per RN notes, no documented behavioral issues and is not attending group. Patient slept, 6.75 hours  Patient seen and staffed with attending Dr. Sherron Flemings.  Patient continues to be dysphoric and agitated.  She does not appear confused.  She reports feeling very paranoid with regards to her fellow patients on the unit.  Patient reports "I do not think, I know that they are thinking bad of me".  Patient makes poor eye contact until directed upon.  Patient then did briefly make eye contact with this writer for a few minutes prior to reverting back to staring on the floor.  Patient continues to want to die as this would solve all of her negative emotions and thoughts.  Patient did verbalize that she was able to attend 1 group but felt too paranoid to stay.  Patient reports that she feels that while her physical body may be overall healthy, her mind is not and that "there is no point in having a healthy body as I do not have a healthy mind".  Patient was mildly more goal-directed today and that she mentions how she may want to go to a long-term facility.  Patient unable to describe exactly what this facility would entail.  Patient initially also was very aggravated by staff as she reports that they were "rude" as well as "very loud last night and prevented  me from sleeping".  Patient also insists that she did not receive the right medications last night because she reports that Zyprexa is a dissolvable and she did not receive any dissolvable medications.  Discussed with patient that Zyprexa Zydis is a dissolvable form of Zyprexa which she was on regular Zyprexa.  Patient verbalized understanding but insisted that she received 2 melatonin and 1 Minipress as opposed to 1 Zyprexa, 1 melatonin, and 1 Minipress.  Patient expressed frustration about not having any breakfast even though she specifically requested nothing for breakfast.  Patient then requested cereal and milk if possible.  Patient received cereal and milk and thanked this Clinical research associate for them.    Principal Problem: Severe recurrent major depressive disorder with psychotic features (HCC) Diagnosis: Principal Problem:   Severe recurrent major depressive disorder with psychotic features (HCC) Active Problems:   Borderline personality disorder (HCC)   Nicotine dependence with current use   GAD (generalized anxiety disorder)  Total Time spent with patient: 30 minutes  Past Psychiatric History: See H&P  Past Medical History:  Past Medical History:  Diagnosis Date   Anxiety    Borderline personality disorder in adult [facility])    Hearing deficit 07/13/2020   MDD (major depressive disorder)    History reviewed. No pertinent surgical history. Family History: History reviewed. No pertinent family history. Family Psychiatric  History: See H&P Social History:  Social History   Substance and Sexual Activity  Alcohol Use Not Currently     Social History  Substance and Sexual Activity  Drug Use Not Currently   Types: Cocaine, Marijuana    Social History   Socioeconomic History   Marital status: Single    Spouse name: Not on file   Number of children: Not on file   Years of education: Not on file   Highest education level: Not on file  Occupational History   Not on file  Tobacco Use    Smoking status: Some Days    Packs/day: 0.20    Years: 40.00    Pack years: 8.00    Types: Cigarettes   Smokeless tobacco: Never  Vaping Use   Vaping Use: Every day   Start date: 04/07/2020   Substances: Nicotine, Flavoring  Substance and Sexual Activity   Alcohol use: Not Currently   Drug use: Not Currently    Types: Cocaine, Marijuana   Sexual activity: Not on file  Other Topics Concern   Not on file  Social History Narrative   Not on file   Social Determinants of Health   Financial Resource Strain: Not on file  Food Insecurity: Not on file  Transportation Needs: Not on file  Physical Activity: Not on file  Stress: Not on file  Social Connections: Not on file   Additional Social History:     Sleep: fair  Appetite:  Fair  Current Medications: Current Facility-Administered Medications  Medication Dose Route Frequency Provider Last Rate Last Admin   acetaminophen (TYLENOL) tablet 650 mg  650 mg Oral Q6H PRN Laveda Abbe, NP   650 mg at 02/19/21 2103   hydrOXYzine (ATARAX/VISTARIL) tablet 25 mg  25 mg Oral TID PRN Dahlia Byes C, NP   25 mg at 02/19/21 2103   ibuprofen (ADVIL) tablet 600 mg  600 mg Oral Q6H PRN Laveda Abbe, NP   600 mg at 02/18/21 2041   OLANZapine zydis (ZYPREXA) disintegrating tablet 5 mg  5 mg Oral Q8H PRN Laveda Abbe, NP   5 mg at 02/17/21 1025   And   LORazepam (ATIVAN) tablet 1 mg  1 mg Oral PRN Laveda Abbe, NP       And   ziprasidone (GEODON) injection 20 mg  20 mg Intramuscular PRN Laveda Abbe, NP       melatonin tablet 5 mg  5 mg Oral QHS Onuoha, Josephine C, NP   5 mg at 02/20/21 2126   multivitamin with minerals tablet 1 tablet  1 tablet Oral Daily Kyden Potash, Harrold Donath, MD   1 tablet at 02/20/21 0801   OLANZapine (ZYPREXA) tablet 2.5 mg  2.5 mg Oral Daily Park Pope, MD       And   OLANZapine (ZYPREXA) tablet 5 mg  5 mg Oral QHS Park Pope, MD   5 mg at 02/20/21 2127   polyethylene  glycol (MIRALAX / GLYCOLAX) packet 17 g  17 g Oral Daily PRN Laveda Abbe, NP       prazosin (MINIPRESS) capsule 1 mg  1 mg Oral QHS Laveda Abbe, NP   1 mg at 02/20/21 2126   propranolol (INDERAL) tablet 10 mg  10 mg Oral TID Phineas Inches, MD   10 mg at 02/20/21 1211   senna (SENOKOT) tablet 8.6 mg  1 tablet Oral Daily PRN Laveda Abbe, NP       sertraline (ZOLOFT) tablet 175 mg  175 mg Oral Daily Carlyn Reichert, MD   175 mg at 02/20/21 0801   thiamine tablet 100 mg  100 mg  Oral Daily Avigdor Dollar, MD   100 mg at 02/20/21 0801    Lab Results:  No results found for this or any previous visit (from the past 48 hour(s)).   Blood Alcohol level:  Lab Results  Component Value Date   ETH <10 02/08/2021   ETH <10 07/13/2020    Metabolic Disorder Labs: Lab Results  Component Value Date   HGBA1C 5.7 (H) 02/08/2021   MPG 116.89 02/08/2021   No results found for: PROLACTIN Lab Results  Component Value Date   CHOL 222 (H) 02/11/2021   TRIG 200 (H) 02/11/2021   HDL 41 02/11/2021   CHOLHDL 5.4 02/11/2021   VLDL 40 02/11/2021   LDLCALC 141 (H) 02/11/2021   LDLCALC 187 (H) 02/08/2021    Physical Findings: AIMS: not yet assessed CIWA:  CIWA-Ar Total: 0  Musculoskeletal: Strength & Muscle Tone: within normal limits Gait & Station: normal Patient leans: N/A  Psychiatric Specialty Exam:  Presentation  General Appearance: Casual  Eye Contact: poor Speech:Clear and Coherent  Speech Volume:Increased  Handedness:Right   Mood and Affect  Mood: "aggravated"  Affect: dysphoric  Thought Processes: ruminative and concrete  Orientation: full  Thought Content: fixated on her inability to improve her depression and anxiety, suicidal thoughts without plan  History of Schizophrenia/Schizoaffective disorder:No  Duration of Psychotic Symptoms:N/A  Hallucinations: none reported  Ideas of Reference:None  Suicidal Thoughts: suicidal  thoughts without plan  Homicidal Thoughts: none reported   Sensorium  Memory:Immediate Fair; Recent Fair; Remote Fair  Judgment:Poor  Insight:Poor   Executive Functions  Concentration:Fair  Attention Span:Fair  Recall: fair  Fund of Knowledge:Fair  Language:Good   Psychomotor Activity  Psychomotor Activity: normal   Assets  Assets:Communication Skills; Financial Resources/Insurance; Resilience   Sleep  Sleep: fair  Physical Exam Vitals and nursing note reviewed.  HENT:     Head: Normocephalic.  Pulmonary:     Effort: Pulmonary effort is normal.  Neurological:     General: No focal deficit present.     Mental Status: She is alert.   ROS: not assessed on this encounter Blood pressure 96/78, pulse 83, temperature 98.4 F (36.9 C), temperature source Oral, resp. rate 16, height 5\' 8"  (1.727 m), weight 100.7 kg, SpO2 99 %. Body mass index is 33.75 kg/m.   Treatment Plan Summary: Daily contact with patient to assess and evaluate symptoms and progress in treatment and Medication management.  MDD, recurrent severe with psychotic features:  The patient continues to appear less anxious and confused.  Patient still very pessimistic and depressed.  Concern for paranoia has become more evident based on today's assessment.  Plan to increase Zyprexa and cross-taper patient from Zoloft to Prozac over next 4 days.  -Increase Zyprexa Zydis to 2.5 mg AM / 7.5 mg QHS - for increasing paranoia  Cross-taper from Zoloft to Prozac: -Decrease Zoloft to 100 mg on 11/18, 50 mg on 11/19, and 25 mg on 11/20, then STOP -Start Prozac 20 mg on 11/18, increase to 30 mg on 11/19, increase to 40 mg on 11/20 -We will monitor patient for any significant side effects from this cross-taper   Anxiety:  -Continue Hydroxyzine 25 mg po tid as needed for anxiety -DECREASE propranolol from 10 mg tid -> to 10 mg bid - for anxiety and tachycardia. Decrease dose due to daytime fatigue.    PTSD  and Insomnia:  -Continue Melatonin 5 mg po at bed time for sleep.  -Continue Minipress 1 mg po qhs.   Agitation protocol:  Geodon 20 mg IM as needed for agitation Ativan 1 mg PO as needed for anxiety Zyprexa Zydis 5 mg PO every 8 hours as needed for agitation.   Continue other prn medications as ordered for other medical complaints.   Continue every 15 Minute safety checks Encourage participation on the therapeutic milieu and group therapy  Discharge planning in progress   Park Pope, MD 02/21/2021, 7:25 AM  Total Time Spent in Direct Patient Care:  I personally spent 60 minutes on the unit in direct patient care. The direct patient care time included face-to-face time with the patient, reviewing the patient's chart, communicating with other professionals, and coordinating care. Greater than 50% of this time was spent in counseling or coordinating care with the patient regarding goals of hospitalization, psycho-education, and discharge planning needs.  I have independently evaluated the patient during a face-to-face assessment on 02/21/21. I reviewed the patient's chart, and I participated in key portions of the service. I discussed the case with the [employer], and I agree with the assessment and plan of care as documented in the [employer] note, as addended by me or notated below:  I saw the patient multiple times today, for medication management and psychotherapy. Patient is agreeable with medication changes, as outlined above. I agree with the resident note and plan. The patient is very negative and fixated on her past life experiences and is unwilling to accept that her life, emotions, and emotional health can ever be improved.  The patient did complete her homework from yesterday and her homework from the morning interview, which was to come up with 2 things that she is in control of (this patient believes she lacks control in her life in general and while on the unit) -she  came up with the following things: Her behavior, and "taking care of myself".  Patient set a goal for herself to take a shower today, which she did, by the time the patient was interviewed again later in the afternoon.  She reports that she had not showered in many days.  Additionally, the patient completed her homework that was assigned yesterday afternoon and also this morning, to think of for positive qualities about herself and they are: 1 compassionate 2 attractive 3 intuitive 4 insightful.  The patient makes minimal progress in attributing these qualities to herself, and reports that although other people at 1 point in time would have thought she had these qualities, she does not believe she has these qualities, and "my bad qualities outweigh those good qualities that I do not have anymore".   Phineas Inches, MD Psychiatrist

## 2021-02-21 NOTE — Progress Notes (Signed)
Pt visible in dayroom at brief intervals this shift. Presents with blunted affect, irritable / agitated on initial interactions. Unwilling to participate in care this shift including scheduled groups and was very argumentative with Clinical research associate current medication regimen. Tolerates meals well. Denies HI, AVH and pain. Remains passive SI, without plan and verbally contracts for safety. Support and reassurance offered to pt. Q 15 minutes safety maintained without self harm gestures. All medications administered with verbal education and effects monitored. Pt encouraged to voice concerns.  Pt safe on and off unit. Guarded and minimal even when with peers in dayroom.

## 2021-02-21 NOTE — Group Note (Signed)
Date:  02/21/2021 Time:  4:00 PM  Group Topic/Focus:  Dimensions of Wellness:   The focus of this group is to introduce the topic of wellness and discuss the role each dimension of wellness plays in total health.    Participation Level:  Did Not Attend  Participation Quality:    Affect:    Cognitive:    Insight:   Engagement in Group:    Modes of Intervention:    Additional Comments:    Reymundo Poll 02/21/2021, 4:00 PM

## 2021-02-21 NOTE — Group Note (Signed)
Date:  02/21/2021 Time:  9:48 AM  Group Topic/Focus:  Orientation:   The focus of this group is to educate the patient on the purpose and policies of crisis stabilization and provide a format to answer questions about their admission.  The group details unit policies and expectations of patients while admitted.    Participation Level:  Did Not Attendparticipation Quality:      Affect:      Cognitive:    Insight:   Engagement in Group:    Modes of Intervention:    Additional Comments:    Reymundo Poll 02/21/2021, 9:48 AM

## 2021-02-21 NOTE — Progress Notes (Signed)
Psychoeducational Group Note  Date:  02/21/2021 Time:  2102  Group Topic/Focus:  Wrap-Up Group:   The focus of this group is to help patients review their daily goal of treatment and discuss progress on daily workbooks.  Participation Level: Did Not Attend  Participation Quality:  Not Applicable  Affect:  Not Applicable  Cognitive:  Not Applicable  Insight:  Not Applicable  Engagement in Group: Not Applicable  Additional Comments:  The patient did not attend group.   Hazle Coca S 02/21/2021, 9:02 PM

## 2021-02-21 NOTE — Progress Notes (Signed)
Pt kept to herself much of the evening, pt very sad and depressed tonight.     02/21/21 2300  Psych Admission Type (Psych Patients Only)  Admission Status Involuntary  Psychosocial Assessment  Patient Complaints Anxiety  Eye Contact Avertive  Facial Expression Anxious;Sad;Angry  Affect Appropriate to circumstance  Speech Argumentative;Logical/coherent  Interaction Attention-seeking;Arrogant;Defensive  Motor Activity Slow  Appearance/Hygiene Disheveled  Behavior Characteristics Unwilling to participate  Mood Labile;Pleasant;Sad;Preoccupied  Aggressive Behavior  Effect No apparent injury  Thought Process  Coherency Concrete thinking  Content Ambivalence  Delusions None reported or observed  Perception WDL  Hallucination None reported or observed  Judgment Poor  Confusion None  Danger to Self  Current suicidal ideation? Passive  Self-Injurious Behavior Some self-injurious ideation observed or expressed.  No lethal plan expressed   Agreement Not to Harm Self Yes  Description of Agreement Pt verbally contracts for safety.  Danger to Others  Danger to Others None reported or observed

## 2021-02-21 NOTE — Group Note (Signed)
Date:  02/21/2021 Time:  5:06 PM  Group Topic/Focus:  Self Care:   The focus of this group is to help patients understand the importance of self-care in order to improve or restore emotional, physical, spiritual, interpersonal, and financial health.    Participation Level:  Did Not Attend  Participation Quality:    Affect:    Cognitive:    Insight:   Engagement in Group:    Modes of Intervention:    Additional Comments:    Reymundo Poll 02/21/2021, 5:06 PM

## 2021-02-22 MED ORDER — OLANZAPINE 2.5 MG PO TABS
2.5000 mg | ORAL_TABLET | Freq: Every day | ORAL | Status: DC
  Administered 2021-02-23 – 2021-02-25 (×3): 2.5 mg via ORAL
  Filled 2021-02-22 (×5): qty 1

## 2021-02-22 MED ORDER — OLANZAPINE 10 MG PO TABS
10.0000 mg | ORAL_TABLET | Freq: Every day | ORAL | Status: DC
  Administered 2021-02-22 – 2021-02-24 (×3): 10 mg via ORAL
  Filled 2021-02-22 (×5): qty 1
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
## 2021-02-22 NOTE — Group Note (Signed)
LCSW Group Therapy Note   Group Date: 02/22/2021 Start Time: 1300 End Time: 1400  Type of Therapy:  Group Therapy   Topic of Therapy:  Gratitude   Participation Level:  Active   Due to the COVID-19 pandemic, this group has been supplemented with worksheets.   Summary of Progress/Problems: CSW provided worksheet to patient due to COVID. CSW offered to meet individually with patient as needed  Felizardo Hoffmann, Theresia Majors 02/22/2021  1:13 PM

## 2021-02-22 NOTE — [facility] Group Notes (Signed)
Tech provided worksheets to patient due to Covid, instant of during physical group.

## 2021-02-22 NOTE — Progress Notes (Signed)
Pt received PRN Vistaril 25 mg PO at 1732 for complain of anxiety. Affect remains blunted with labile mood, brief to avertive eye contact and is irritable / agitated on interactions. Left medication window in an agitated state this morning when redirected to stop interrupting writer on her 4th attempt while her medications are being scanned to prevent error "I don't want it anymore". Pt later approached writer for her medications later to get her medications. Visible in dayroom with peers but remains guarded and minimal. Appears to be much calmer and redirectable in the evenings in comparison to mornings.  Emotional support and reassurance offered to pt. Safety checks maintained at Q 15 minutes intervals. All medications administered with verbal education and effects monitored. Writer encouraged pt to voice concerns.  Pt tolerates all medications and meals well without discomfort. Remains safe on unit. Denies concerns at this time. Reports relief from anxiety when reassessed at 1830 post PRN Vistaril.

## 2021-02-22 NOTE — [facility] Group Notes (Signed)
Patient did not attend morning orientation group.  

## 2021-02-22 NOTE — Group Note (Signed)
Recreation Therapy Group Note   Group Topic:Stress Management  Group Date: 02/22/2021 Start Time: 0930 End Time: 0945 Facilitators: Caroll Rancher, [place] Location: [address] Dayroom   Goal Area(s) Addresses:  Patient will identify positive stress management techniques. Patient will identify benefits of using stress management post d/c.  Group Description:  Meditation.  LRT played a meditation that focused on setting personal boundaries putting, saying to others can be a way of saying yes to yourself and putting yourself first as a form of self care.  Patients were to listen and follow along as meditation played to fully engage.    Affect/Mood: N/A   Participation Level: Did not attend    Clinical Observations/Individualized Feedback: Pt did not attend group session. W   Plan: Continue to engage patient in RT group sessions 2-3x/week.   Caroll Rancher, LRT,CTRS 02/22/2021 10:59 AM

## 2021-02-23 LAB — CBC
HCT: 44.9 % (ref 36.0–46.0)
Hemoglobin: 14.5 g/dL (ref 12.0–15.0)
MCH: 28.2 pg (ref 26.0–34.0)
MCHC: 32.3 g/dL (ref 30.0–36.0)
MCV: 87.2 fL (ref 80.0–100.0)
Platelets: 285 10*3/uL (ref 150–400)
RBC: 5.15 MIL/uL — ABNORMAL HIGH (ref 3.87–5.11)
RDW: 13.7 % (ref 11.5–15.5)
WBC: 8.4 10*3/uL (ref 4.0–10.5)
nRBC: 0 % (ref 0.0–0.2)

## 2021-02-23 NOTE — Progress Notes (Signed)
    02/22/21 2020  Psych Admission Type (Psych Patients Only)  Admission Status Involuntary  Psychosocial Assessment  Patient Complaints Agitation;Anxiety  Eye Contact Avertive  Facial Expression Anxious;Sad;Angry  Affect Appropriate to circumstance  Speech Logical/coherent  Interaction Attention-seeking;Arrogant;Defensive  Motor Activity Slow  Appearance/Hygiene Disheveled  Behavior Characteristics Unwilling to participate  Mood Labile;Suspicious;Preoccupied  Thought Administrator, sports thinking  Content Ambivalence  Delusions None reported or observed  Perception WDL  Hallucination None reported or observed  Judgment Poor  Confusion None  Danger to Self  Current suicidal ideation? Passive  Self-Injurious Behavior Some self-injurious ideation observed or expressed.  No lethal plan expressed   Agreement Not to Harm Self Yes  Description of Agreement Pt verbally contracts for safety.  Danger to Others  Danger to Others None reported or observed

## 2021-02-23 NOTE — Progress Notes (Signed)
  BP low this am.  Provider notified. Hydrated with Gatorade.  Encouraged patient to continue to hydrating.  Will request dayshift RN recheck BP.   02/23/21 0617  Vital Signs  Temp 97.7 F (36.5 C)  Temp Source Oral  Pulse Rate 64  BP 108/70  BP Location Right Arm  BP Method Automatic  Patient Position (if appropriate) Sitting      02/23/21 0619  Vital Signs  Pulse Rate 80  BP (!) 83/58  BP Location Right Arm  BP Method Automatic  Patient Position (if appropriate) Standing

## 2021-02-23 NOTE — Progress Notes (Signed)
Pt's BP sitting was 104/69. When standing, BP decreased. Pt denies any complaints. States she does not feel dizziness, lightheaded, or fatigued. She stated "I feel fine." Pt ambulated with steady gait. Bedtime med Prazosin was held due to vitals. Pt upset she was not able to receive medication. She stated "If I did not get Inderal after 6pm, this would not have happen. Now I won't be able to sleep without my medication." Pt given PRN medication for agitation. Provider made aware.     02/23/21 2121  Vital Signs  Pulse Rate 83  Pulse Rate Source Monitor  BP (!) 87/65  BP Location Right Arm  BP Method Automatic  Patient Position (if appropriate) Standing

## 2021-02-23 NOTE — [facility] Group Notes (Signed)
.  Psychoeducational Group Note  Date 02/23/2021 Time: 0900-1000    Goal Setting   Purpose of Group: Group Focus: affirmation, clarity of thought, and goals/reality orientation Treatment Modality:  Psychoeducation Interventions utilized were assignment, group exercise, and support  Purpose: To be able to understand and verbalize the reason for their admission to the hospital. To understand that the medication helps with their chemical imbalance but they also need to work on their choices in life. To be challenged to develop a list of 30 positives about themselves. Also introduce the concept that "feelings" are not reality.    Participation Level:  Did not attend   Allura Doepke A 

## 2021-02-23 NOTE — Progress Notes (Signed)
     02/22/21 1950  Vital Signs  Pulse Rate 79  Pulse Rate Source Dinamap  Resp 16  BP 101/65  BP Location Left Arm  BP Method Automatic  Patient Position (if appropriate) Sitting    02/22/21 1952  Vital Signs  Pulse Rate 88  Pulse Rate Source Dinamap  BP (!) 84/60  BP Location Left Arm  BP Method Automatic  Patient Position (if appropriate) Lying    Provider notified of low BP.  Pt hydrated with pitcher of Gatorade.

## 2021-02-23 NOTE — Progress Notes (Signed)
[facility] MD Progress Note  02/23/2021 5:34 PM Anna [name]  MRN:  [id number] Subjective:  Patient is a 50 year old female admitted under IVC from [facility] for MDD with Psychotic features with known psychiatric hx of MDD recurrent severe without psychosis, alcohol use disorder with dependence, borderline personality disorder, cannabis use disorder, cocaine use disorder, GAD, PTSD, and suicide attempt.  Chart Review, 24 hr Events: The patient's chart was reviewed and nursing notes were reviewed. The patient's case was discussed in multidisciplinary team meeting.  Per MAR: - Patient is generally compliant with scheduled meds.  - PRNs for agitation: none Per RN notes, no documented behavioral issues and is not attending group. Patient slept, 6.25 hours  Patient seen today in her room. She didn't yell and there was limited cursing. She was occasionally irritable but it wasn't too bad. She had some conversation but remains pretty negative. She continues to endorse being suicidal. She believes that she has been depressed her whole life and has never been happy. She might have smiled, but has never been happy. She denies hallucinations. No homicidal ideations. She asks if I believe in ECT, so the claims of complete hopelessness might not be 100% true. She also asks about the medication plan.   Principal Problem: Severe recurrent major depressive disorder with psychotic features (HCC) Diagnosis: Principal Problem:   Severe recurrent major depressive disorder with psychotic features (HCC) Active Problems:   Borderline personality disorder (HCC)   Nicotine dependence with current use   GAD (generalized anxiety disorder)  Total Time spent with patient: 30 minutes including medication management and psychotherapy  Past Psychiatric History: See H&P  Past Medical History:  Past Medical History:  Diagnosis Date   Anxiety    Borderline personality disorder in adult [facility])    Hearing deficit  07/13/2020   MDD (major depressive disorder)    History reviewed. No pertinent surgical history. Family History: History reviewed. No pertinent family history. Family Psychiatric  History: See H&P Social History:  Social History   Substance and Sexual Activity  Alcohol Use Not Currently     Social History   Substance and Sexual Activity  Drug Use Not Currently   Types: Cocaine, Marijuana    Social History   Socioeconomic History   Marital status: Single    Spouse name: Not on file   Number of children: Not on file   Years of education: Not on file   Highest education level: Not on file  Occupational History   Not on file  Tobacco Use   Smoking status: Some Days    Packs/day: 0.20    Years: 40.00    Pack years: 8.00    Types: Cigarettes   Smokeless tobacco: Never  Vaping Use   Vaping Use: Every day   Start date: 04/07/2020   Substances: Nicotine, Flavoring  Substance and Sexual Activity   Alcohol use: Not Currently   Drug use: Not Currently    Types: Cocaine, Marijuana   Sexual activity: Not on file  Other Topics Concern   Not on file  Social History Narrative   Not on file   Social Determinants of Health   Financial Resource Strain: Not on file  Food Insecurity: Not on file  Transportation Needs: Not on file  Physical Activity: Not on file  Stress: Not on file  Social Connections: Not on file   Additional Social History:     Sleep: fair  Appetite:  Fair  Current Medications: Current Facility-Administered Medications  Medication Dose Route Frequency Provider Last Rate Last Admin   acetaminophen (TYLENOL) tablet 650 mg  650 mg Oral Q6H PRN Laveda Abbe, NP   650 mg at 02/21/21 1724   [START ON 02/24/2021] FLUoxetine (PROZAC) capsule 40 mg  40 mg Oral Daily Park Pope, MD       hydrOXYzine (ATARAX/VISTARIL) tablet 25 mg  25 mg Oral TID PRN Dahlia Byes C, NP   25 mg at 02/22/21 1732   ibuprofen (ADVIL) tablet 600 mg  600 mg Oral Q6H PRN  Laveda Abbe, NP   600 mg at 02/23/21 1529   OLANZapine zydis (ZYPREXA) disintegrating tablet 5 mg  5 mg Oral Q8H PRN Laveda Abbe, NP   5 mg at 02/23/21 1324   And   LORazepam (ATIVAN) tablet 1 mg  1 mg Oral PRN Laveda Abbe, NP       And   ziprasidone (GEODON) injection 20 mg  20 mg Intramuscular PRN Laveda Abbe, NP       melatonin tablet 5 mg  5 mg Oral QHS Dahlia Byes C, NP   5 mg at 02/22/21 2237   multivitamin with minerals tablet 1 tablet  1 tablet Oral Daily Massengill, Harrold Donath, MD   1 tablet at 02/23/21 0815   OLANZapine (ZYPREXA) tablet 10 mg  10 mg Oral Seabron Spates, MD   10 mg at 02/22/21 2238   And   OLANZapine (ZYPREXA) tablet 2.5 mg  2.5 mg Oral Daily Park Pope, MD   2.5 mg at 02/23/21 0815   polyethylene glycol (MIRALAX / GLYCOLAX) packet 17 g  17 g Oral Daily PRN Laveda Abbe, NP       prazosin (MINIPRESS) capsule 1 mg  1 mg Oral QHS Laveda Abbe, NP   1 mg at 02/21/21 2130   propranolol (INDERAL) tablet 10 mg  10 mg Oral BID Phineas Inches, MD   10 mg at 02/22/21 1732   senna (SENOKOT) tablet 8.6 mg  1 tablet Oral Daily PRN Laveda Abbe, NP       Melene Muller ON 02/24/2021] sertraline (ZOLOFT) tablet 25 mg  25 mg Oral Once Park Pope, MD       thiamine tablet 100 mg  100 mg Oral Daily Massengill, Harrold Donath, MD   100 mg at 02/23/21 0815    Lab Results:  No results found for this or any previous visit (from the past 48 hour(s)).    Blood Alcohol level:  Lab Results  Component Value Date   ETH <10 02/08/2021   ETH <10 07/13/2020    Metabolic Disorder Labs: Lab Results  Component Value Date   HGBA1C 5.7 (H) 02/08/2021   MPG 116.89 02/08/2021   No results found for: PROLACTIN Lab Results  Component Value Date   CHOL 222 (H) 02/11/2021   TRIG 200 (H) 02/11/2021   HDL 41 02/11/2021   CHOLHDL 5.4 02/11/2021   VLDL 40 02/11/2021   LDLCALC 141 (H) 02/11/2021   LDLCALC 187 (H) 02/08/2021     Physical Findings: AIMS: not yet assessed CIWA:  CIWA-Ar Total: 0  Musculoskeletal: Strength & Muscle Tone: within normal limits Gait & Station: normal Patient leans: N/A  Psychiatric Specialty Exam:  Presentation  General Appearance: Well Groomed  Eye Contact: poor Speech:Clear and Coherent  Speech Volume: regular  Handedness:Right   Mood and Affect  Mood: "I don't feel good"  Affect: dysphoric  Thought Processes: ruminative and concrete  Orientation: full  Thought Content:  fixated on her inability to improve her depression and anxiety, suicidal thoughts without plan.  Patient continues to be paranoid about others.  History of Schizophrenia/Schizoaffective disorder:No  Duration of Psychotic Symptoms:Greater than six months  Hallucinations: none reported  Ideas of Reference:None  Suicidal Thoughts: suicidal thoughts with plan  Homicidal Thoughts: none reported   Sensorium  Memory:Immediate Good  Judgment:Poor  Insight:Poor   Executive Functions  Concentration:Fair  Attention Span:Fair  Recall: fair  Fund of Knowledge:Fair  Language:Fair   Psychomotor Activity  Psychomotor Activity: normal   Assets  Assets:Leisure Time   Sleep  Sleep: fair  Physical Exam Vitals and nursing note reviewed.  HENT:     Head: Normocephalic.  Pulmonary:     Effort: Pulmonary effort is normal.  Neurological:     General: No focal deficit present.     Mental Status: She is alert.   ROS: not assessed on this encounter Blood pressure 103/73, pulse 71, temperature (!) 97.5 F (36.4 C), temperature source Oral, resp. rate 16, height 5\' 8"  (1.727 m), weight 100.7 kg, SpO2 98 %. Body mass index is 33.75 kg/m.   Treatment Plan Summary: Daily contact with patient to assess and evaluate symptoms and progress in treatment and Medication management.  MDD, recurrent severe with psychotic features:  The patient continues to appear less anxious and  confused.  Patient still very pessimistic and depressed.  Concern for paranoia has become more evident based on today's assessment.  Plan to increase Zyprexa and cross-taper patient from Zoloft to Prozac over next 4 days.  -INCREASE Zyprexa Zydis from 2.5 mg qam and 7.5 mg qhs -> to 2.5 mg qAM and 10 mg QHS - for increasing paranoia, and for depression   Cross-taper from Zoloft to Prozac: -Decrease Zoloft to 100 mg on 11/18, 50 mg on 11/19, and 25 mg on 11/20, then STOP -Start Prozac 20 mg on 11/18, increase to 30 mg on 11/19, increase to 40 mg on 11/20 -We will monitor patient for any significant side effects from this cross-taper   Anxiety:  -Continue Hydroxyzine 25 mg po tid as needed for anxiety -Continue propranolol 10 mg bid - for anxiety and tachycardia.   PTSD and Insomnia:  -Continue Melatonin 5 mg po at bed time for sleep.  -Continue Minipress 1 mg po qhs.   Agitation protocol:  Geodon 20 mg IM as needed for agitation Ativan 1 mg PO as needed for anxiety Zyprexa Zydis 5 mg PO every 8 hours as needed for agitation.   Continue other prn medications as ordered for other medical complaints.   Continue every 15 Minute safety checks Encourage participation on the therapeutic milieu and group therapy  Discharge planning in progress   12/20, MD 02/23/2021, 5:34 PM  Total Time Spent in Direct Patient Care:  I personally spent 30 minutes on the unit in direct patient care. The direct patient care time included face-to-face time with the patient, reviewing the patient's chart, communicating with other professionals, and coordinating care. Greater than 50% of this time was spent in counseling or coordinating care with the patient regarding goals of hospitalization, psycho-education, and discharge planning needs.

## 2021-02-23 NOTE — [facility] Group Notes (Signed)
.  Psychoeducational Group Note  Date 02/23/2021 Time: 0900-1000    Goal Setting   Purpose of Group: Group Focus: affirmation, clarity of thought, and goals/reality orientation Treatment Modality:  Psychoeducation Interventions utilized were assignment, group exercise, and support  Purpose: To be able to understand and verbalize the reason for their admission to the hospital. To understand that the medication helps with their chemical imbalance but they also need to work on their choices in life. To be challenged to develop a list of 30 positives about themselves. Also introduce the concept that "feelings" are not reality.    Participation Level:  Did not attend   Anna [name] A 

## 2021-02-23 NOTE — Progress Notes (Signed)
Adult Psychoeducational Group Note  Date:  02/23/2021 Time:  9:06 PM  Group Topic/Focus:  Wrap-Up Group:   The focus of this group is to help patients review their daily goal of treatment and discuss progress on daily workbooks.  Participation Level:  Did Not Attend  Participation Quality:   Did Not Attend  Affect:   Did Not Attend  Cognitive:   Did Not Attend  Insight: None  Engagement in Group:   Did Not Attend  Modes of Intervention:   Did Not Attend  Additional Comments:  Pt did not attend evening wrap up group tonight.  Felipa Furnace 02/23/2021, 9:06 PM

## 2021-02-23 NOTE — Progress Notes (Signed)
   Blood pressure Rechecked; Holding scheduled Prazosin.  Provider aware.   02/22/21 2234  Vital Signs  Pulse Rate 60  BP 108/74  BP Location Right Arm  BP Method Automatic  Patient Position (if appropriate) Sitting     02/22/21 2236  Vital Signs  Pulse Rate 83  BP 93/60  BP Location Right Arm  BP Method Automatic  Patient Position (if appropriate) Standing

## 2021-02-23 NOTE — [facility] Group Notes (Signed)
Patient did not attend morning goals group.  

## 2021-02-23 NOTE — Group Note (Signed)
[facility] Group Notes: (Clinical Social Work)   02/23/2021      Type of Therapy:  Group Therapy   Participation Level:  Did Not Attend - was invited both individually by [facility] and by overhead announcement, chose not to attend.   Ambrose Mantle, LCSW 02/23/2021, 11:16 AM

## 2021-02-23 NOTE — Plan of Care (Signed)
  Problem: Education: Goal: Emotional status will improve Outcome: Not Progressing Goal: Mental status will improve Outcome: Not Progressing Goal: Verbalization of understanding the information provided will improve Outcome: Not Progressing   

## 2021-02-24 LAB — RESP PANEL BY RT-PCR (FLU A&B, COVID) ARPGX2
Influenza A by PCR: NEGATIVE
Influenza B by PCR: NEGATIVE
SARS Coronavirus 2 by RT PCR: NEGATIVE

## 2021-02-24 LAB — BASIC METABOLIC PANEL
Anion gap: 10 (ref 5–15)
BUN: 17 mg/dL (ref 6–20)
CO2: 22 mmol/L (ref 22–32)
Calcium: 9.8 mg/dL (ref 8.9–10.3)
Chloride: 106 mmol/L (ref 98–111)
Creatinine, Ser: 0.74 mg/dL (ref 0.44–1.00)
GFR, Estimated: >60 mL/min (ref >60)
Glucose, Bld: 111 mg/dL — ABNORMAL HIGH (ref 70–99)
Potassium: 4.2 mmol/L (ref 3.5–5.1)
Sodium: 138 mmol/L (ref 135–145)

## 2021-02-24 NOTE — [facility] Group Notes (Signed)
Psychoeducational Group Note  Date:  02/24/2021 Time:  1300-1400   Group Topic/Focus: This is [name] continuation of the group from Saturday. Pt's have been asked to formulate [name] list of 30 positives about themselves. This list is to be read 2 times [name] day for 30 days, looking in [name] mirror. Changing patterns of negative self talk. Also discussed is the fact that there have been some people who hurt us in the past. We keep that memory alive within us. Ways to cope with this are discused   Participation Level:  Did not attend Anna [name]  

## 2021-02-24 NOTE — [facility] Group Notes (Signed)
Pt did not attend wrap up group this evening. Pt was invited to group via intercom announcement.

## 2021-02-24 NOTE — Progress Notes (Signed)
D:  Patient denied HI.  Denied A/V hallucinations.  SI thoughts continue. A:  Medications administered per MD orders.  Emotional support and encouragement given patient. R:  Safety maintained with 15 minute checks.

## 2021-02-24 NOTE — Progress Notes (Signed)
Patient is irritable this evening continue to endorse Passive SI with no plan. Denies HI/A/VH and verbally contracted for safety. Patient stayed in her room most of the shift. Compliant with medication. Support and encouragement provided. Q 15 minutes safety checks ongoing. Patient remains safe.

## 2021-02-24 NOTE — Group Note (Signed)
[facility] LCSW Group Therapy Note  Date/Time:  02/24/2021 11:00am-11:30am  Type of Therapy and Topic:  Group Therapy:  Healthy and Unhealthy Supports  Participation Level:  Did Not Attend   Description of Group:  Patients in this group were led in a discussion about the differences between healthy and unhealthy supports.  They were introduced to the idea of adding a variety of healthy supports to address the various needs in their lives, as well as the idea that unhealthy supports need to be eliminated from their lives through the use of appropriate boundary setting.  A song entitled, "My Own Hero" was played and then discussed.  Patients acknowledged as a whole that it is important to be involved in one's own recovery journey, and that by doing so, they are acting as a hero to themselves.  Therapeutic Goals:   1)  discuss the difference between healthy and unhealthy supports  2)  describe the importance of adding healthy supports to stay well once out of the hospital  3)  discuss options for how to address unhealthy supports  5)  encourage active participation in one's own recovery plan    Summary of Patient Progress:  The patient  was invited to group, did not attend.   Therapeutic Modalities:   Motivational Interviewing Brief Solution-Focused Therapy  Angell Honse Grossman-Orr, LCSW       

## 2021-02-24 NOTE — Plan of Care (Signed)
Nurse discussed anxiety, depression and coping skills with patient.  

## 2021-02-24 NOTE — Progress Notes (Signed)
Pt agitated and anxious. Pt has limited to no interaction with peers during shift. Pt stays mostly in her room. Pt has minimal interaction with staff and does not like to engage in conversations and becomes increasingly annoyed when is approached by staff. Pt's Minipress held due to her BP. Pt became upset she could not receive med but another alternative was given to help manage her anxiety/agitation. She is worried she would not be able to sleep with her Minipress. Pt appears to be resting comfortably in room after being given her medications.     02/23/21 2125  Psych Admission Type (Psych Patients Only)  Admission Status Involuntary  Psychosocial Assessment  Patient Complaints Agitation;Anxiety  Eye Contact Avertive  Facial Expression Sad;Angry;Anxious  Affect Anxious;Angry  Speech Aggressive  Interaction Attention-seeking;Arrogant;Defensive  Motor Activity Other (Comment) (WNL)  Appearance/Hygiene Unremarkable  Behavior Characteristics Unwilling to participate;Anxious;Guarded  Mood Angry;Suspicious  Thought Process  Coherency Concrete thinking  Content Ambivalence  Delusions None reported or observed  Perception WDL  Hallucination None reported or observed  Judgment Poor  Confusion None  Danger to Self  Current suicidal ideation? Passive  Self-Injurious Behavior Some self-injurious ideation observed or expressed.  No lethal plan expressed   Agreement Not to Harm Self Yes  Description of Agreement Pt verbally contracts for safety.  Danger to Others  Danger to Others None reported or observed

## 2021-02-24 NOTE — Progress Notes (Addendum)
[facility] MD Progress Note  02/24/2021 9:58 AM Anna [name]  MRN:  [id number] Subjective:  Patient is a 50 year old female admitted under IVC from [facility] for MDD with Psychotic features with known psychiatric hx of MDD recurrent severe without psychosis, alcohol use disorder with dependence, borderline personality disorder, cannabis use disorder, cocaine use disorder, GAD, PTSD, and suicide attempt.  Chart Review, 24 hr Events: The patient's chart was reviewed and nursing notes were reviewed. The patient's case was discussed in multidisciplinary team meeting.  Per MAR: - Patient is generally compliant with scheduled meds.  - PRNs for agitation: none Per RN notes, no documented behavioral issues and is not attending group. Patient slept, 4.5 hours  Patient seen and staffed with attending Dr. Loleta Chance  Patient continues to be dysphoric and agitated.  She reports that she is still depressed, down, and sad. Patient continues feeling paranoid with regards to her fellow patients on the unit.  Patient reports "I never leave my room because of the fact that I note that they are staring at me like we know something about me".  Patient makes minimal eye contact. She reports that her sleep is poor because she did not get her prazosin last night.  Reports having low appetite. Patient reports that her concentration is poor, but it appears that her concentration is improving. Patient continues to have suicidal thoughts, stating "I know if I wish hard enough and long enough that I will be able to kill myself".   Patient denies having homicidal thoughts.  Discussed plan to cross titrate between Zoloft to Prozac.  Patient expresses paranoia that the cross titration has been done wrong stating "you might say one thing but they are doing something else".  Patient also expressed interest in ECT for treatment resistant depression.    Principal Problem: Severe recurrent major depressive disorder with psychotic  features (HCC) Diagnosis: Principal Problem:   Severe recurrent major depressive disorder with psychotic features (HCC) Active Problems:   Borderline personality disorder (HCC)   Nicotine dependence with current use   GAD (generalized anxiety disorder)  Total Time spent with patient: 45 minutes including medication management and psychotherapy  Past Psychiatric History: See H&P  Past Medical History:  Past Medical History:  Diagnosis Date   Anxiety    Borderline personality disorder in adult [facility])    Hearing deficit 07/13/2020   MDD (major depressive disorder)    History reviewed. No pertinent surgical history. Family History: History reviewed. No pertinent family history. Family Psychiatric  History: See H&P Social History:  Social History   Substance and Sexual Activity  Alcohol Use Not Currently     Social History   Substance and Sexual Activity  Drug Use Not Currently   Types: Cocaine, Marijuana    Social History   Socioeconomic History   Marital status: Single    Spouse name: Not on file   Number of children: Not on file   Years of education: Not on file   Highest education level: Not on file  Occupational History   Not on file  Tobacco Use   Smoking status: Some Days    Packs/day: 0.20    Years: 40.00    Pack years: 8.00    Types: Cigarettes   Smokeless tobacco: Never  Vaping Use   Vaping Use: Every day   Start date: 04/07/2020   Substances: Nicotine, Flavoring  Substance and Sexual Activity   Alcohol use: Not Currently   Drug use: Not Currently  Types: Cocaine, Marijuana   Sexual activity: Not on file  Other Topics Concern   Not on file  Social History Narrative   Not on file   Social Determinants of Health   Financial Resource Strain: Not on file  Food Insecurity: Not on file  Transportation Needs: Not on file  Physical Activity: Not on file  Stress: Not on file  Social Connections: Not on file   Additional Social History:      Sleep: fair  Appetite:  Fair  Current Medications: Current Facility-Administered Medications  Medication Dose Route Frequency Provider Last Rate Last Admin   acetaminophen (TYLENOL) tablet 650 mg  650 mg Oral Q6H PRN Laveda Abbe, NP   650 mg at 02/21/21 1724   FLUoxetine (PROZAC) capsule 40 mg  40 mg Oral Daily Park Pope, MD   40 mg at 02/24/21 0801   hydrOXYzine (ATARAX/VISTARIL) tablet 25 mg  25 mg Oral TID PRN Earney Navy, NP   25 mg at 02/23/21 2127   ibuprofen (ADVIL) tablet 600 mg  600 mg Oral Q6H PRN Laveda Abbe, NP   600 mg at 02/23/21 1529   OLANZapine zydis (ZYPREXA) disintegrating tablet 5 mg  5 mg Oral Q8H PRN Laveda Abbe, NP   5 mg at 02/23/21 1324   And   LORazepam (ATIVAN) tablet 1 mg  1 mg Oral PRN Laveda Abbe, NP       And   ziprasidone (GEODON) injection 20 mg  20 mg Intramuscular PRN Laveda Abbe, NP       melatonin tablet 5 mg  5 mg Oral QHS Dahlia Byes C, NP   5 mg at 02/23/21 2125   multivitamin with minerals tablet 1 tablet  1 tablet Oral Daily Massengill, Harrold Donath, MD   1 tablet at 02/24/21 0802   OLANZapine (ZYPREXA) tablet 10 mg  10 mg Oral Seabron Spates, MD   10 mg at 02/23/21 2125   And   OLANZapine (ZYPREXA) tablet 2.5 mg  2.5 mg Oral Daily Park Pope, MD   2.5 mg at 02/24/21 0802   polyethylene glycol (MIRALAX / GLYCOLAX) packet 17 g  17 g Oral Daily PRN Laveda Abbe, NP       prazosin (MINIPRESS) capsule 1 mg  1 mg Oral QHS Laveda Abbe, NP   1 mg at 02/21/21 2130   propranolol (INDERAL) tablet 10 mg  10 mg Oral BID Phineas Inches, MD   10 mg at 02/24/21 0803   senna (SENOKOT) tablet 8.6 mg  1 tablet Oral Daily PRN Laveda Abbe, NP       thiamine tablet 100 mg  100 mg Oral Daily Massengill, Harrold Donath, MD   100 mg at 02/24/21 0803    Lab Results:  Results for orders placed or performed during the hospital encounter of 02/08/21 (from the past 48 hour(s))  CBC      Status: Abnormal   Collection Time: 02/23/21  6:11 PM  Result Value Ref Range   WBC 8.4 4.0 - 10.5 K/uL   RBC 5.15 (H) 3.87 - 5.11 MIL/uL   Hemoglobin 14.5 12.0 - 15.0 g/dL   HCT 93.7 16.9 - 67.8 %   MCV 87.2 80.0 - 100.0 fL   MCH 28.2 26.0 - 34.0 pg   MCHC 32.3 30.0 - 36.0 g/dL   RDW 93.8 10.1 - 75.1 %   Platelets 285 150 - 400 K/uL   nRBC 0.0 0.0 - 0.2 %  Comment: Performed at [facility], [address], [place], [place] [address]  Resp Panel by RT-PCR (Flu A&B, Covid) Nasopharyngeal Swab     Status: None   Collection Time: 02/24/21  4:16 AM   Specimen: Nasopharyngeal Swab; Nasopharyngeal(NP) swabs in vial transport medium  Result Value Ref Range   SARS Coronavirus 2 by RT PCR NEGATIVE NEGATIVE    Comment: (NOTE) SARS-CoV-2 target nucleic acids are NOT DETECTED.  The SARS-CoV-2 RNA is generally detectable in upper respiratory specimens during the acute phase of infection. The lowest concentration of SARS-CoV-2 viral copies this assay can detect is 138 copies/mL. A negative result does not preclude SARS-Cov-2 infection and should not be used as the sole basis for treatment or other patient management decisions. A negative result may occur with  improper specimen collection/handling, submission of specimen other than nasopharyngeal swab, presence of viral mutation(s) within the areas targeted by this assay, and inadequate number of viral copies(<138 copies/mL). A negative result must be combined with clinical observations, patient history, and epidemiological information. The expected result is Negative.  Fact Sheet for Patients:  BloggerCourse.com  Fact Sheet for Healthcare Providers:  SeriousBroker.it  This test is no t yet approved or cleared by the [place] FDA and  has been authorized for detection and/or diagnosis of SARS-CoV-2 by FDA under an Emergency Use Authorization (EUA). This EUA will  remain  in effect (meaning this test can be used) for the duration of the COVID-19 declaration under Section 564(b)(1) of the Act, 21 U.S.C.section 360bbb-3(b)(1), unless the authorization is terminated  or revoked sooner.       Influenza A by PCR NEGATIVE NEGATIVE   Influenza B by PCR NEGATIVE NEGATIVE    Comment: (NOTE) The Xpert Xpress SARS-CoV-2/FLU/RSV plus assay is intended as an aid in the diagnosis of influenza from Nasopharyngeal swab specimens and should not be used as a sole basis for treatment. Nasal washings and aspirates are unacceptable for Xpert Xpress SARS-CoV-2/FLU/RSV testing.  Fact Sheet for Patients: BloggerCourse.com  Fact Sheet for Healthcare Providers: SeriousBroker.it  This test is not yet approved or cleared by the [place] FDA and has been authorized for detection and/or diagnosis of SARS-CoV-2 by FDA under an Emergency Use Authorization (EUA). This EUA will remain in effect (meaning this test can be used) for the duration of the COVID-19 declaration under Section 564(b)(1) of the Act, 21 U.S.C. section 360bbb-3(b)(1), unless the authorization is terminated or revoked.  Performed at [facility], [address] [place], [place] [address]      Blood Alcohol level:  Lab Results  Component Value Date   [facility] <10 02/08/2021   ETH <10 07/13/2020    Metabolic Disorder Labs: Lab Results  Component Value Date   HGBA1C 5.7 (H) 02/08/2021   MPG 116.89 02/08/2021   No results found for: PROLACTIN Lab Results  Component Value Date   CHOL 222 (H) 02/11/2021   TRIG 200 (H) 02/11/2021   HDL 41 02/11/2021   CHOLHDL 5.4 02/11/2021   VLDL 40 02/11/2021   LDLCALC 141 (H) 02/11/2021   LDLCALC 187 (H) 02/08/2021    Physical Findings: AIMS: not yet assessed CIWA:  CIWA-Ar Total: 0  Musculoskeletal: Strength & Muscle Tone: within normal limits Gait & Station: normal Patient  leans: N/A  Psychiatric Specialty Exam:  Presentation  General Appearance: Well Groomed  Eye Contact: poor Speech:Clear and Coherent  Speech Volume:Increased  Handedness:Right   Mood and Affect  Mood: "aggravated"  Affect: dysphoric  Thought Processes: ruminative and concrete  Orientation: full  Thought Content: fixated on her inability to improve her depression and anxiety, suicidal thoughts without plan.  Patient continues to be paranoid about others.  History of Schizophrenia/Schizoaffective disorder:No  Duration of Psychotic Symptoms:Greater than six months  Hallucinations: none reported  Ideas of Reference:None  Suicidal Thoughts: suicidal thoughts with plan  Homicidal Thoughts: none reported   Sensorium  Memory:Immediate Good  Judgment:Poor  Insight:Poor   Executive Functions  Concentration:Fair  Attention Span:Fair  Recall: fair  Fund of Knowledge:Fair  Language:Fair   Psychomotor Activity  Psychomotor Activity: normal   Assets  Assets:Leisure Time   Sleep  Sleep: fair  Physical Exam Vitals and nursing note reviewed.  HENT:     Head: Normocephalic.  Pulmonary:     Effort: Pulmonary effort is normal.  Neurological:     General: No focal deficit present.     Mental Status: She is alert.   ROS: not assessed on this encounter Blood pressure 111/71, pulse 73, temperature 97.6 F (36.4 C), temperature source Oral, resp. rate 16, height 5\' 8"  (1.727 m), weight 100.7 kg, SpO2 96 %. Body mass index is 33.75 kg/m.   Treatment Plan Summary: Daily contact with patient to assess and evaluate symptoms and progress in treatment and Medication management.  MDD, recurrent severe with psychotic features:  The patient continues to appear less anxious and confused.  Patient still very pessimistic and depressed.  Concern for paranoia has become more evident based on today's assessment.  Plan to increase Zyprexa and cross-taper patient from  Zoloft to Prozac over next 4 days.  -Continue Zyprexa 2.5 mg qam and 10 mg qhs for increasing paranoia, and for depression   Cross-taper from Zoloft to Prozac: -Decrease Zoloft to 25 mg on 11/20, then STOP -Increase Prozac to 40 mg on 11/20 -We will monitor patient for any significant side effects from this cross-taper   Anxiety:  -Continue Hydroxyzine 25 mg po tid as needed for anxiety -Continue propranolol 10 mg bid - for anxiety and tachycardia.   PTSD and Insomnia:  -Continue Melatonin 5 mg po at bed time for sleep.  -Continue Minipress 1 mg po qhs.   Agitation protocol:  Geodon 20 mg IM as needed for agitation Ativan 1 mg PO as needed for anxiety Zyprexa Zydis 5 mg PO every 8 hours as needed for agitation.   Continue other prn medications as ordered for other medical complaints.   Continue every 15 Minute safety checks Encourage participation on the therapeutic milieu and group therapy  Discharge planning in progress   Park Pope, MD 02/24/2021, 9:58 AM

## 2021-02-25 ENCOUNTER — Encounter (HOSPITAL_COMMUNITY): Payer: Self-pay

## 2021-02-25 MED ORDER — OLANZAPINE 2.5 MG PO TABS
2.5000 mg | ORAL_TABLET | Freq: Every day | ORAL | Status: DC
  Administered 2021-02-26: 2.5 mg via ORAL
  Filled 2021-02-25 (×3): qty 1

## 2021-02-25 MED ORDER — OLANZAPINE 2.5 MG PO TABS
12.5000 mg | ORAL_TABLET | Freq: Every day | ORAL | Status: DC
  Administered 2021-02-25: 12.5 mg via ORAL
  Filled 2021-02-25 (×3): qty 5

## 2021-02-25 NOTE — [facility] Group Notes (Signed)
Patient did not attend group.    Spiritual care group on grief and loss facilitated by chaplain Katy Ridley Schewe, [facility]   Group Goal:   Support / Education around grief and loss   Members engage in facilitated group support and psycho-social education.   Group Description:   Following introductions and group rules, group members engaged in facilitated group dialog and support around topic of loss, with particular support around experiences of loss in their lives. Group Identified types of loss (relationships / self / things) and identified patterns, circumstances, and changes that precipitate losses. Reflected on thoughts / feelings around loss, normalized grief responses, and recognized variety in grief experience. Group noted Worden's four tasks of grief in discussion.   Group drew on Adlerian / Rogerian, narrative, MI,    

## 2021-02-25 NOTE — Progress Notes (Signed)
Patient did not attend Nurse Psycho-Ed group.

## 2021-02-25 NOTE — Group Note (Signed)
Occupational Therapy Group Note  Group Topic: Sensory Modulation  Group Date: 02/25/2021 Start Time: 1400 End Time: 1445 Facilitators: Iven Earnhart, OT/L   Group Description: Group encouraged increased engagement and participation through discussion focused on sensory modulation and self-soothing through use of the 8 senses. Discussion introduced the concept of sensory modulation and integration, focusing on how we can utilize our body and it's senses to self-soothe or cope, when we are experiencing an over or under-whelming sensation or feeling. Group members were introduced to a sensory diet checklist as a helpful tool/resource that can be utilized to identify what activities and strategies we prefer and do not prefer based upon our response to different stimulus. The concept of alerting vs calming activities was also introduced to understand how to counteract how we are feeling (Example: when we are feeling overwhelmed/stressed, engage in something calming. When we are feeling depressed/low energy, engage in something alerting). Group members engaged actively in discussion sharing their own personal sensory likes/dislikes.      Therapeutic Goal(s):  Identify self-soothing calming vs alerting activities   Identify and create a sensory diet to assist in self-soothing sensory strategies    Participation Level: Did not attend   Plan: Continue to engage patient in OT groups 2 - 3x/week.  02/25/2021  Jaeson Molstad, OT/L    

## 2021-02-25 NOTE — Progress Notes (Signed)
Writer was obtaining a sitting BP from pt and noticed a BP of 93/68. Writer asked pt if they felt light headed or dizzy and pt responded no. Writer asked pt if they were drinking fluids throughout the day, and pt became upset with Clinical research associate and yelled "I don't fucking know damn!" Writer tried to explain why they were asking these questions in which pt proceeded to snatched off their BP cuff and yelled "fuck it!" To the Clinical research associate. Writer was attempting to obtain a standing BP when they snatched off the cuff. Writer abruptly left pt room and alerted their nurse.

## 2021-02-25 NOTE — Progress Notes (Signed)
   02/24/21 2134 02/24/21 2136 02/25/21 0631  Vital Signs  Temp 97.9 F (36.6 C)  --  (!) 97.5 F (36.4 C)  Temp Source Oral  --  Oral  Pulse Rate 65 80 84  Pulse Rate Source Monitor Monitor Monitor  Resp 18 18  --   BP 123/84 100/76 93/68  BP Location Left Arm Right Arm Left Arm  BP Method Automatic Automatic Automatic  Patient Position (if appropriate) Sitting Standing Sitting   Patient encouraged to drink PO gatorade offered and ginger ale. Patient stated " I need the propanolol to be stopped so I can continue to take my Minipress because the Minipress help me to sleep" Patient reassured and support provided. Will continue to monitor. Patient remains safe.

## 2021-02-25 NOTE — Progress Notes (Signed)
   02/25/21 1622  Vital Signs  Pulse Rate 71  Pulse Rate Source Monitor  Resp 18  BP 104/72  BP Location Right Arm  BP Method Automatic  Patient Position (if appropriate) Sitting   Patient refused HS V/S and minipress and multivitamin this shift. Patient educated on medications but non compliant with education. Support and encouragement provided.

## 2021-02-25 NOTE — [facility] Group Notes (Signed)
Pt was encouraged, but did not attend relaxation group.  

## 2021-02-25 NOTE — BH IP Treatment Plan (Signed)
Interdisciplinary Treatment and Diagnostic Plan Update  02/25/2021 Time of Session: 9:40am Anna [name] MRN: [id number]  Principal Diagnosis: Severe recurrent major depressive disorder with psychotic features [facility])  Secondary Diagnoses: Principal Problem:   Severe recurrent major depressive disorder with psychotic features (HCC) Active Problems:   Borderline personality disorder (HCC)   Nicotine dependence with current use   GAD (generalized anxiety disorder)   Current Medications:  Current Facility-Administered Medications  Medication Dose Route Frequency Provider Last Rate Last Admin   acetaminophen (TYLENOL) tablet 650 mg  650 mg Oral Q6H PRN Laveda Abbe, NP   650 mg at 02/21/21 1724   FLUoxetine (PROZAC) capsule 40 mg  40 mg Oral Daily Park Pope, MD   40 mg at 02/25/21 0809   hydrOXYzine (ATARAX/VISTARIL) tablet 25 mg  25 mg Oral TID PRN Dahlia Byes C, NP   25 mg at 02/23/21 2127   ibuprofen (ADVIL) tablet 600 mg  600 mg Oral Q6H PRN Laveda Abbe, NP   600 mg at 02/23/21 1529   OLANZapine zydis (ZYPREXA) disintegrating tablet 5 mg  5 mg Oral Q8H PRN Laveda Abbe, NP   5 mg at 02/23/21 1324   And   LORazepam (ATIVAN) tablet 1 mg  1 mg Oral PRN Laveda Abbe, NP       And   ziprasidone (GEODON) injection 20 mg  20 mg Intramuscular PRN Laveda Abbe, NP       melatonin tablet 5 mg  5 mg Oral QHS Onuoha, Josephine C, NP   5 mg at 02/24/21 2139   multivitamin with minerals tablet 1 tablet  1 tablet Oral Daily Massengill, Harrold Donath, MD   1 tablet at 02/24/21 0802   OLANZapine (ZYPREXA) tablet 10 mg  10 mg Oral Seabron Spates, MD   10 mg at 02/24/21 2139   And   OLANZapine (ZYPREXA) tablet 2.5 mg  2.5 mg Oral Daily Park Pope, MD   2.5 mg at 02/25/21 0809   polyethylene glycol (MIRALAX / GLYCOLAX) packet 17 g  17 g Oral Daily PRN Laveda Abbe, NP       prazosin (MINIPRESS) capsule 1 mg  1 mg Oral QHS Laveda Abbe, NP   1  mg at 02/24/21 2138   senna (SENOKOT) tablet 8.6 mg  1 tablet Oral Daily PRN Laveda Abbe, NP       thiamine tablet 100 mg  100 mg Oral Daily Massengill, Harrold Donath, MD   100 mg at 02/25/21 0809   PTA Medications: Medications Prior to Admission  Medication Sig Dispense Refill Last Dose   brexpiprazole (REXULTI) 1 MG TABS tablet Take by mouth daily. (Patient not taking: Reported on 02/09/2021)   Not Taking   buPROPion (WELLBUTRIN XL) 150 MG 24 hr tablet Take 150 mg by mouth daily. (Patient not taking: Reported on 02/09/2021)   Not Taking   busPIRone (BUSPAR) 10 MG tablet Take 10 mg by mouth 3 (three) times daily. (Patient not taking: Reported on 02/09/2021)   Not Taking   citalopram (CELEXA) 40 MG tablet Take 40 mg by mouth daily. (Patient not taking: Reported on 02/09/2021)   Not Taking   desvenlafaxine (PRISTIQ) 50 MG 24 hr tablet Take 50 mg by mouth daily. (Patient not taking: Reported on 02/09/2021)   Not Taking   hydrOXYzine (VISTARIL) 25 MG capsule Take 26 mg by mouth every 6 (six) hours as needed for anxiety. (Patient not taking: Reported on 02/09/2021)   Not Taking  LATUDA 20 MG TABS tablet Take 20 mg by mouth daily. (Patient not taking: Reported on 02/09/2021)   Not Taking   medroxyPROGESTERone (PROVERA) 10 MG tablet Take 20 mg by mouth 2 (two) times daily. (Patient not taking: Reported on 02/09/2021)   Not Taking   QUEtiapine (SEROQUEL) 200 MG tablet Take 200 mg by mouth at bedtime. (Patient not taking: Reported on 02/09/2021)   Not Taking   topiramate (TOPAMAX) 50 MG tablet Take 50 mg by mouth 2 (two) times daily. (Patient not taking: Reported on 02/09/2021)   Not Taking   traZODone (DESYREL) 150 MG tablet Take 150 mg by mouth at bedtime. (Patient not taking: Reported on 02/09/2021)   Not Taking   TRINTELLIX 20 MG TABS tablet Take 20 mg by mouth daily. (Patient not taking: Reported on 02/09/2021)   Not Taking    Patient Stressors: Marital or family conflict   Medication change or  noncompliance    Patient Strengths: General fund of knowledge  Motivation for treatment/growth   Treatment Modalities: Medication Management, Group therapy, Case management,  1 to 1 session with clinician, Psychoeducation, Recreational therapy.   Physician Treatment Plan for Primary Diagnosis: Severe recurrent major depressive disorder with psychotic features (HCC) Long Term Goal(s): Improvement in symptoms so as ready for discharge   Short Term Goals: Ability to identify changes in lifestyle to reduce recurrence of condition will improve Ability to verbalize feelings will improve Ability to disclose and discuss suicidal ideas Ability to demonstrate self-control will improve Ability to identify and develop effective coping behaviors will improve Ability to maintain clinical measurements within normal limits will improve Compliance with prescribed medications will improve Ability to identify triggers associated with substance abuse/mental health issues will improve  Medication Management: Evaluate patient's response, side effects, and tolerance of medication regimen.  Therapeutic Interventions: 1 to 1 sessions, Unit Group sessions and Medication administration.  Evaluation of Outcomes: Progressing  Physician Treatment Plan for Secondary Diagnosis: Principal Problem:   Severe recurrent major depressive disorder with psychotic features (HCC) Active Problems:   Borderline personality disorder (HCC)   Nicotine dependence with current use   GAD (generalized anxiety disorder)  Long Term Goal(s): Improvement in symptoms so as ready for discharge   Short Term Goals: Ability to identify changes in lifestyle to reduce recurrence of condition will improve Ability to verbalize feelings will improve Ability to disclose and discuss suicidal ideas Ability to demonstrate self-control will improve Ability to identify and develop effective coping behaviors will improve Ability to maintain  clinical measurements within normal limits will improve Compliance with prescribed medications will improve Ability to identify triggers associated with substance abuse/mental health issues will improve     Medication Management: Evaluate patient's response, side effects, and tolerance of medication regimen.  Therapeutic Interventions: 1 to 1 sessions, Unit Group sessions and Medication administration.  Evaluation of Outcomes: Progressing   RN Treatment Plan for Primary Diagnosis: Severe recurrent major depressive disorder with psychotic features (HCC) Long Term Goal(s): Knowledge of disease and therapeutic regimen to maintain health will improve  Short Term Goals: Ability to remain free from injury will improve, Ability to verbalize frustration and anger appropriately will improve, Ability to demonstrate self-control, Ability to participate in decision making will improve, Ability to verbalize feelings will improve, Ability to disclose and discuss suicidal ideas, and Compliance with prescribed medications will improve  Medication Management: RN will administer medications as ordered by provider, will assess and evaluate patient's response and provide education to patient for prescribed medication. RN  will report any adverse and/or side effects to prescribing provider.  Therapeutic Interventions: 1 on 1 counseling sessions, Psychoeducation, Medication administration, Evaluate responses to treatment, Monitor vital signs and CBGs as ordered, Perform/monitor CIWA, COWS, AIMS and Fall Risk screenings as ordered, Perform wound care treatments as ordered.  Evaluation of Outcomes: Progressing   LCSW Treatment Plan for Primary Diagnosis: Severe recurrent major depressive disorder with psychotic features (HCC) Long Term Goal(s): Safe transition to appropriate next level of care at discharge, Engage patient in therapeutic group addressing interpersonal concerns.  Short Term Goals: Engage patient in  aftercare planning with referrals and resources, Increase social support, Increase ability to appropriately verbalize feelings, Facilitate patient progression through stages of change regarding substance use diagnoses and concerns, Identify triggers associated with mental health/substance abuse issues, and Increase skills for wellness and recovery  Therapeutic Interventions: Assess for all discharge needs, 1 to 1 time with Social worker, Explore available resources and support systems, Assess for adequacy in community support network, Educate family and significant other(s) on suicide prevention, Complete Psychosocial Assessment, Interpersonal group therapy.  Evaluation of Outcomes: Progressing   Progress in Treatment: Attending groups: Yes. Participating in groups: Yes. Taking medication as prescribed: Yes. Toleration medication: Yes. Family/Significant other contact made: Yes, individual(s) contacted:  Friend  Patient understands diagnosis: No. Discussing patient identified problems/goals with staff: Yes. Medical problems stabilized or resolved: Yes. Denies suicidal/homicidal ideation: Yes. Issues/concerns per patient self-inventory: No.     New problem(s) identified: No, Describe:  None    New Short Term/Long Term Goal(s): medication stabilization, elimination of SI thoughts, development of comprehensive mental wellness plan.    Patient Goals: "To try and get my depressive symptoms under control"    Discharge Plan or Barriers: Return to the [facility] and follow up with [facility] for therapy and medication management    Reason for Continuation of Hospitalization: Anxiety   Estimated Length of Stay: 3 to 5 days    Scribe for Treatment Team: Otelia Santee, LCSW 02/25/2021 10:44 AM

## 2021-02-25 NOTE — Progress Notes (Signed)
Pt did not attend orientation and goals group. 

## 2021-02-25 NOTE — Group Note (Signed)
LCSW Group Therapy Note   Group Date: 02/25/2021 Start Time: 1300 End Time: 1400   Type of Therapy and Topic:  Group Therapy:   Participation Level:  Active  CSW was unable to hold group due to RN group being held during this time.   Aram Beecham, LCSWA 02/25/2021  2:17 PM

## 2021-02-25 NOTE — Plan of Care (Signed)
  Problem: Education: Goal: Emotional status will improve Outcome: Not Progressing Goal: Verbalization of understanding the information provided will improve Outcome: Not Progressing   Problem: Activity: Goal: Interest or engagement in activities will improve Outcome: Not Progressing   Problem: Coping: Goal: Ability to demonstrate self-control will improve Outcome: Not Progressing   Problem: Health Behavior/Discharge Planning: Goal: Compliance with treatment plan for underlying cause of condition will improve Outcome: Not Progressing  Patient continues to be isolative and very irritable. Staying in her room most of the shift. Support and encouragement provided. Patient remains safe.

## 2021-02-25 NOTE — [facility] Counselor (Signed)
CSW sent a message to Dr. Toni Amend at [facility] to inquire about inpatient ECT services for the patient.  CSW is awaiting more information from Dr. Toni Amend.  Once informed, CSW will let Dr. Sherron Flemings know so that a decision can be made on the Pt' potential follow-up care.

## 2021-02-25 NOTE — Progress Notes (Addendum)
[facility] MD Progress Note  02/25/2021 2:42 PM Anna [name]  MRN:  [id number] Subjective:  Patient is a 50 year old female admitted under IVC from [facility] for MDD with Psychotic features with known psychiatric hx of MDD recurrent severe without psychosis, alcohol use disorder with dependence, borderline personality disorder, cannabis use disorder, cocaine use disorder, GAD, PTSD, and suicide attempt.  Chart Review, 24 hr Events: The patient's chart was reviewed and nursing notes were reviewed. The patient's case was discussed in multidisciplinary team meeting.  Per MAR: - Patient is generally compliant with scheduled meds.  - PRNs for agitation: none Per RN notes, no documented behavioral issues and is not attending group. Patient slept, 6.25 hours  Patient seen and staffed with attending Dr. Sherron Flemings  Patient was more dysphoric and agitated.  She reports that she is still depressed, down, and sad. Patient continues feeling paranoid with regards to her fellow patients on the unit.  Patient reports "I never leave my room because of the fact that I hate the stares as if I'm some zombie".  Patient makes minimal eye contact. She reports that her sleep is "alright".  Reports having "alright" appetite. Patient reports that her concentration is poor, but it appears that her concentration is improving. Patient continues to have suicidal thoughts, stating "I just want to die.  I would just want to die.  I just want to die.  I do not want to breathe anymore".   Patient denies having homicidal thoughts. Patient accused this Clinical research associate of not doing what he said he would but was unable to specify what exactly he did not do.  Patient also insistent that she no longer wants to be on Inderal as this lowers her blood pressure.   Discussed plan to continue prozac.  Discussed plan to refer patient to ECT.  Patient complained of memory problems related to ECT but then was very insistent she could not remember  anything right now given her depression.  Patient agreeable to ECT referral stating "I hope they put me under and I do not wake up".    Principal Problem: Severe recurrent major depressive disorder with psychotic features (HCC) Diagnosis: Principal Problem:   Severe recurrent major depressive disorder with psychotic features (HCC) Active Problems:   Borderline personality disorder (HCC)   Nicotine dependence with current use   GAD (generalized anxiety disorder)  Total Time spent with patient: 45 minutes including medication management and psychotherapy  Past Psychiatric History: See H&P  Past Medical History:  Past Medical History:  Diagnosis Date   Anxiety    Borderline personality disorder in adult [facility])    Hearing deficit 07/13/2020   MDD (major depressive disorder)    History reviewed. No pertinent surgical history. Family History: History reviewed. No pertinent family history. Family Psychiatric  History: See H&P Social History:  Social History   Substance and Sexual Activity  Alcohol Use Not Currently     Social History   Substance and Sexual Activity  Drug Use Not Currently   Types: Cocaine, Marijuana    Social History   Socioeconomic History   Marital status: Single    Spouse name: Not on file   Number of children: Not on file   Years of education: Not on file   Highest education level: Not on file  Occupational History   Not on file  Tobacco Use   Smoking status: Some Days    Packs/day: 0.20    Years: 40.00    Pack years:  8.00    Types: Cigarettes   Smokeless tobacco: Never  Vaping Use   Vaping Use: Every day   Start date: 04/07/2020   Substances: Nicotine, Flavoring  Substance and Sexual Activity   Alcohol use: Not Currently   Drug use: Not Currently    Types: Cocaine, Marijuana   Sexual activity: Not on file  Other Topics Concern   Not on file  Social History Narrative   Not on file   Social Determinants of Health   Financial Resource  Strain: Not on file  Food Insecurity: Not on file  Transportation Needs: Not on file  Physical Activity: Not on file  Stress: Not on file  Social Connections: Not on file   Additional Social History:     Sleep: fair  Appetite:  Fair  Current Medications: Current Facility-Administered Medications  Medication Dose Route Frequency Provider Last Rate Last Admin   acetaminophen (TYLENOL) tablet 650 mg  650 mg Oral Q6H PRN Laveda Abbe, NP   650 mg at 02/21/21 1724   FLUoxetine (PROZAC) capsule 40 mg  40 mg Oral Daily Park Pope, MD   40 mg at 02/25/21 0809   hydrOXYzine (ATARAX/VISTARIL) tablet 25 mg  25 mg Oral TID PRN Dahlia Byes C, NP   25 mg at 02/25/21 1402   ibuprofen (ADVIL) tablet 600 mg  600 mg Oral Q6H PRN Laveda Abbe, NP   600 mg at 02/23/21 1529   OLANZapine zydis (ZYPREXA) disintegrating tablet 5 mg  5 mg Oral Q8H PRN Laveda Abbe, NP   5 mg at 02/23/21 1324   And   LORazepam (ATIVAN) tablet 1 mg  1 mg Oral PRN Laveda Abbe, NP       And   ziprasidone (GEODON) injection 20 mg  20 mg Intramuscular PRN Laveda Abbe, NP       melatonin tablet 5 mg  5 mg Oral QHS Dahlia Byes C, NP   5 mg at 02/24/21 2139   multivitamin with minerals tablet 1 tablet  1 tablet Oral Daily Massengill, Harrold Donath, MD   1 tablet at 02/24/21 0802   OLANZapine (ZYPREXA) tablet 12.5 mg  12.5 mg Oral Seabron Spates, MD       And   Melene Muller ON 02/26/2021] OLANZapine (ZYPREXA) tablet 2.5 mg  2.5 mg Oral Daily Park Pope, MD       polyethylene glycol (MIRALAX / GLYCOLAX) packet 17 g  17 g Oral Daily PRN Laveda Abbe, NP       prazosin (MINIPRESS) capsule 1 mg  1 mg Oral QHS Laveda Abbe, NP   1 mg at 02/24/21 2138   senna (SENOKOT) tablet 8.6 mg  1 tablet Oral Daily PRN Laveda Abbe, NP       thiamine tablet 100 mg  100 mg Oral Daily Massengill, Harrold Donath, MD   100 mg at 02/25/21 0809    Lab Results:  Results for orders placed or  performed during the hospital encounter of 02/08/21 (from the past 48 hour(s))  CBC     Status: Abnormal   Collection Time: 02/23/21  6:11 PM  Result Value Ref Range   WBC 8.4 4.0 - 10.5 K/uL   RBC 5.15 (H) 3.87 - 5.11 MIL/uL   Hemoglobin 14.5 12.0 - 15.0 g/dL   HCT 82.9 93.7 - 16.9 %   MCV 87.2 80.0 - 100.0 fL   MCH 28.2 26.0 - 34.0 pg   MCHC 32.3 30.0 - 36.0 g/dL  RDW 13.7 11.5 - 15.5 %   Platelets 285 150 - 400 K/uL   nRBC 0.0 0.0 - 0.2 %    Comment: Performed at [facility], [address], [place], [place] [address]  Resp Panel by RT-PCR (Flu A&B, Covid) Nasopharyngeal Swab     Status: None   Collection Time: 02/24/21  4:16 AM   Specimen: Nasopharyngeal Swab; Nasopharyngeal(NP) swabs in vial transport medium  Result Value Ref Range   SARS Coronavirus 2 by RT PCR NEGATIVE NEGATIVE    Comment: (NOTE) SARS-CoV-2 target nucleic acids are NOT DETECTED.  The SARS-CoV-2 RNA is generally detectable in upper respiratory specimens during the acute phase of infection. The lowest concentration of SARS-CoV-2 viral copies this assay can detect is 138 copies/mL. A negative result does not preclude SARS-Cov-2 infection and should not be used as the sole basis for treatment or other patient management decisions. A negative result may occur with  improper specimen collection/handling, submission of specimen other than nasopharyngeal swab, presence of viral mutation(s) within the areas targeted by this assay, and inadequate number of viral copies(<138 copies/mL). A negative result must be combined with clinical observations, patient history, and epidemiological information. The expected result is Negative.  Fact Sheet for Patients:  BloggerCourse.com  Fact Sheet for Healthcare Providers:  SeriousBroker.it  This test is no t yet approved or cleared by the [place] FDA and  has been authorized for detection and/or  diagnosis of SARS-CoV-2 by FDA under an Emergency Use Authorization (EUA). This EUA will remain  in effect (meaning this test can be used) for the duration of the COVID-19 declaration under Section 564(b)(1) of the Act, 21 U.S.C.section 360bbb-3(b)(1), unless the authorization is terminated  or revoked sooner.       Influenza A by PCR NEGATIVE NEGATIVE   Influenza B by PCR NEGATIVE NEGATIVE    Comment: (NOTE) The Xpert Xpress SARS-CoV-2/FLU/RSV plus assay is intended as an aid in the diagnosis of influenza from Nasopharyngeal swab specimens and should not be used as a sole basis for treatment. Nasal washings and aspirates are unacceptable for Xpert Xpress SARS-CoV-2/FLU/RSV testing.  Fact Sheet for Patients: BloggerCourse.com  Fact Sheet for Healthcare Providers: SeriousBroker.it  This test is not yet approved or cleared by the [place] FDA and has been authorized for detection and/or diagnosis of SARS-CoV-2 by FDA under an Emergency Use Authorization (EUA). This EUA will remain in effect (meaning this test can be used) for the duration of the COVID-19 declaration under Section 564(b)(1) of the Act, 21 U.S.C. section 360bbb-3(b)(1), unless the authorization is terminated or revoked.  Performed at [facility], [address], [place], [place] [address]   Basic metabolic panel     Status: Abnormal   Collection Time: 02/24/21  6:25 PM  Result Value Ref Range   Sodium 138 135 - 145 mmol/L   Potassium 4.2 3.5 - 5.1 mmol/L   Chloride 106 98 - 111 mmol/L   CO2 22 22 - 32 mmol/L   Glucose, Bld 111 (H) 70 - 99 mg/dL    Comment: Glucose reference range applies only to samples taken after fasting for at least 8 hours.   BUN 17 6 - 20 mg/dL   Creatinine, Ser 1.51 0.44 - 1.00 mg/dL   Calcium 9.8 8.9 - 76.1 mg/dL   GFR, Estimated >60 >73 mL/min    Comment: (NOTE) Calculated using the CKD-EPI Creatinine  Equation (2021)    Anion gap 10 5 - 15  Comment: Performed at [facility], [address] [place], [place] [address]     Blood Alcohol level:  Lab Results  Component Value Date   [facility] <10 02/08/2021   ETH <10 07/13/2020    Metabolic Disorder Labs: Lab Results  Component Value Date   HGBA1C 5.7 (H) 02/08/2021   MPG 116.89 02/08/2021   No results found for: PROLACTIN Lab Results  Component Value Date   CHOL 222 (H) 02/11/2021   TRIG 200 (H) 02/11/2021   HDL 41 02/11/2021   CHOLHDL 5.4 02/11/2021   VLDL 40 02/11/2021   LDLCALC 141 (H) 02/11/2021   LDLCALC 187 (H) 02/08/2021    Physical Findings: AIMS: not yet assessed CIWA:  CIWA-Ar Total: 0  Musculoskeletal: Strength & Muscle Tone: within normal limits Gait & Station: normal Patient leans: N/A  Psychiatric Specialty Exam:  Presentation  General Appearance: Well Groomed  Eye Contact: poor Speech:Clear and Coherent  Speech Volume:Increased  Handedness:Right   Mood and Affect  Mood: "bad"  Affect: dysphoric  Thought Processes: ruminative and concrete  Orientation: full  Thought Content: fixated on her inability to improve her depression and anxiety, suicidal thoughts without plan.  Patient continues to be paranoid about others.  History of Schizophrenia/Schizoaffective disorder:No  Duration of Psychotic Symptoms:Greater than six months  Hallucinations: none reported  Ideas of Reference:None  Suicidal Thoughts: suicidal thoughts with plan  Homicidal Thoughts: none reported   Sensorium  Memory:Immediate Good  Judgment:Poor  Insight:Poor   Executive Functions  Concentration:Fair  Attention Span:Fair  Recall: fair  Fund of Knowledge:Fair  Language:Fair   Psychomotor Activity  Psychomotor Activity: normal   Assets  Assets:Leisure Time   Sleep  Sleep: fair  Physical Exam Vitals and nursing note reviewed.  HENT:     Head: Normocephalic.   Pulmonary:     Effort: Pulmonary effort is normal.  Neurological:     General: No focal deficit present.     Mental Status: She is alert.   ROS: not assessed on this encounter Blood pressure 93/68, pulse 84, temperature (!) 97.5 F (36.4 C), temperature source Oral, resp. rate 18, height 5\' 8"  (1.727 m), weight 100.7 kg, SpO2 98 %. Body mass index is 33.75 kg/m.   Treatment Plan Summary: Daily contact with patient to assess and evaluate symptoms and progress in treatment and Medication management.  MDD, recurrent severe with psychotic features:  Patient still very pessimistic and depressed.  Given the severity of the patient's depressive symptoms and persistence of active suicidal ideation with intent and plan, patient will likely require interventional treatment such as ECT or TMS.  Patient may also benefit from esketamine; however given patient's history of paranoia, patient would more likely be a better candidate for ECT than ketamine as well as there have been multiple studies indicating higher remission rate with ECT than ketamine within the scope of treatment refractory unipolar depression.  -INCREASE Zyprexa 2.5 mg qam and 12.5 mg qhs for increasing paranoia, and for depression  -CONTINUE Prozac to 40 mg -CSW has placed ECT referral to [facility] via [facility]. Patient has been reviewed and plan for bed transfer to [facility] for ECT treatment has been made pending an open bed in [facility]. Greatly appreciate assistance from [facility]. [facility] and [facility] CSW.    Anxiety:  -Continue Hydroxyzine 25 mg po tid as needed for anxiety -Continue propranolol 10 mg bid - for anxiety and tachycardia.   PTSD and Insomnia:  -Continue Melatonin 5 mg po at bed time for  sleep.  -Continue Minipress 1 mg po qhs.   Agitation protocol:  Geodon 20 mg IM as needed for agitation Ativan 1 mg PO as needed for anxiety Zyprexa Zydis 5 mg PO every 8 hours as needed for agitation.   Continue other prn medications as ordered  for other medical complaints.   Continue every 15 Minute safety checks Encourage participation on the therapeutic milieu and group therapy  Present discharge plan tentatively is [facility] for ECT treatment   Park Pope, MD 02/25/2021, 2:42 PM

## 2021-02-25 NOTE — Progress Notes (Signed)
Patient ID: Anna [name], female   DOB: 30-Jan-1971, 50 y.o.   MRN: [id number] Pt presents with very irritable mood this morning, affect labile. Writer spoke to patient stating good morning, and patient responded '' well what's so good about it ?'' Verbally hostile to Clinical research associate, Clinical research associate answered that I was happy to be here today, and patient states '' well what's so wrong about wanting to be dead and no be here'' attempted to explore patient mood and thoughts but patient unwilling, verbally hostile to Clinical research associate. Patient then asked if she was eating and sleeping and states '' they took away my minipress and that's what helps me sleep all because of the blood pressure, but that inderal you keep giving drops the pressure. '' Asked patient if she would like to hold inderal, as bp noted to be soft, and patient states '' give me whatever the fuck you want to give me '' encouraged po hydration. Pt has been observed isolating on the unit, refusing to leave for dinner and has not attended groups. She has also refused to complete self inventory. Discussed above with Dr. Hazle Quant. Will continue to monitor.

## 2021-02-26 ENCOUNTER — Inpatient Hospital Stay
Admission: AD | Admit: 2021-02-26 | Discharge: 2021-03-05 | DRG: 885 | Disposition: A | Discharge status: Home / Self Care | Payer: Medicare Other | Source: Intra-hospital | Attending: Psychiatry | Admitting: Psychiatry

## 2021-02-26 ENCOUNTER — Ambulatory Visit (HOSPITAL_COMMUNITY): Payer: Medicare Other

## 2021-02-26 DIAGNOSIS — F17200 Nicotine dependence, unspecified, uncomplicated: Secondary | ICD-10-CM | POA: Diagnosis present

## 2021-02-26 DIAGNOSIS — G4700 Insomnia, unspecified: Secondary | ICD-10-CM | POA: Diagnosis present

## 2021-02-26 DIAGNOSIS — F332 Major depressive disorder, recurrent severe without psychotic features: Secondary | ICD-10-CM | POA: Diagnosis present

## 2021-02-26 DIAGNOSIS — F4310 Post-traumatic stress disorder, unspecified: Secondary | ICD-10-CM | POA: Diagnosis present

## 2021-02-26 DIAGNOSIS — F17210 Nicotine dependence, cigarettes, uncomplicated: Secondary | ICD-10-CM | POA: Diagnosis present

## 2021-02-26 DIAGNOSIS — Z818 Family history of other mental and behavioral disorders: Secondary | ICD-10-CM | POA: Diagnosis not present

## 2021-02-26 DIAGNOSIS — Z5181 Encounter for therapeutic drug level monitoring: Secondary | ICD-10-CM | POA: Diagnosis not present

## 2021-02-26 DIAGNOSIS — F333 Major depressive disorder, recurrent, severe with psychotic symptoms: Secondary | ICD-10-CM | POA: Diagnosis present

## 2021-02-26 DIAGNOSIS — Z9151 Personal history of suicidal behavior: Secondary | ICD-10-CM | POA: Diagnosis not present

## 2021-02-26 DIAGNOSIS — R45851 Suicidal ideations: Secondary | ICD-10-CM | POA: Diagnosis present

## 2021-02-26 DIAGNOSIS — M5136 Other intervertebral disc degeneration, lumbar region: Secondary | ICD-10-CM | POA: Diagnosis present

## 2021-02-26 DIAGNOSIS — F1420 Cocaine dependence, uncomplicated: Secondary | ICD-10-CM | POA: Diagnosis present

## 2021-02-26 DIAGNOSIS — M51369 Other intervertebral disc degeneration, lumbar region without mention of lumbar back pain or lower extremity pain: Secondary | ICD-10-CM | POA: Diagnosis present

## 2021-02-26 MED ORDER — PRAZOSIN HCL 1 MG PO CAPS: 1.0000 mg | ORAL_CAPSULE | Freq: Every day | ORAL | Status: DC

## 2021-02-26 MED ORDER — IBUPROFEN 600 MG PO TABS
600.0000 mg | ORAL_TABLET | Freq: Four times a day (QID) | ORAL | Status: DC | PRN
  Administered 2021-02-27: 600 mg via ORAL
  Filled 2021-02-26: qty 1

## 2021-02-26 MED ORDER — OLANZAPINE 5 MG PO TABS
12.5000 mg | ORAL_TABLET | Freq: Every day | ORAL | Status: DC
  Administered 2021-02-26 – 2021-03-04 (×7): 12.5 mg via ORAL
  Filled 2021-02-26 (×7): qty 1

## 2021-02-26 MED ORDER — FLUOXETINE HCL 20 MG PO CAPS
40.0000 mg | ORAL_CAPSULE | Freq: Every day | ORAL | Status: DC
  Administered 2021-02-27 – 2021-03-05 (×7): 40 mg via ORAL
  Filled 2021-02-26 (×7): qty 2

## 2021-02-26 MED ORDER — ZIPRASIDONE MESYLATE 20 MG IM SOLR: 20.0000 mg | INTRAMUSCULAR | Status: DC | PRN

## 2021-02-26 MED ORDER — FLUOXETINE HCL 40 MG PO CAPS: 40.0000 mg | ORAL_CAPSULE | Freq: Every day | ORAL | 3 refills | Status: DC

## 2021-02-26 MED ORDER — LORAZEPAM 1 MG PO TABS: 1.0000 mg | ORAL_TABLET | Freq: Four times a day (QID) | ORAL | Status: DC | PRN

## 2021-02-26 MED ORDER — OLANZAPINE 5 MG PO TABS
2.5000 mg | ORAL_TABLET | Freq: Every day | ORAL | Status: DC
  Administered 2021-02-27 – 2021-03-05 (×7): 2.5 mg via ORAL
  Filled 2021-02-26 (×7): qty 1

## 2021-02-26 MED ORDER — ACETAMINOPHEN 325 MG PO TABS: 650.0000 mg | ORAL_TABLET | Freq: Four times a day (QID) | ORAL | Status: DC | PRN

## 2021-02-26 MED ORDER — POLYETHYLENE GLYCOL 3350 17 G PO PACK: 17.0000 g | PACK | Freq: Every day | ORAL | Status: DC | PRN

## 2021-02-26 MED ORDER — HYDROXYZINE HCL 25 MG PO TABS: 25.0000 mg | ORAL_TABLET | Freq: Three times a day (TID) | ORAL | 0 refills | Status: DC | PRN

## 2021-02-26 MED ORDER — MELATONIN 5 MG PO TABS
5.0000 mg | ORAL_TABLET | Freq: Every day | ORAL | Status: DC
  Administered 2021-02-26 – 2021-03-04 (×7): 5 mg via ORAL
  Filled 2021-02-26 (×7): qty 1

## 2021-02-26 MED ORDER — OLANZAPINE 5 MG PO TBDP
5.0000 mg | ORAL_TABLET | Freq: Three times a day (TID) | ORAL | Status: DC | PRN
  Administered 2021-02-27: 5 mg via ORAL
  Filled 2021-02-26: qty 1

## 2021-02-26 MED ORDER — HYDROXYZINE HCL 25 MG PO TABS
25.0000 mg | ORAL_TABLET | Freq: Three times a day (TID) | ORAL | Status: DC | PRN
  Administered 2021-03-01: 25 mg via ORAL
  Filled 2021-02-26: qty 1

## 2021-02-26 MED ORDER — PRAZOSIN HCL 1 MG PO CAPS
1.0000 mg | ORAL_CAPSULE | Freq: Every day | ORAL | Status: DC
  Administered 2021-02-26 – 2021-03-04 (×7): 1 mg via ORAL
  Filled 2021-02-26 (×7): qty 1

## 2021-02-26 MED ORDER — SENNA 8.6 MG PO TABS: 1.0000 | ORAL_TABLET | Freq: Every day | ORAL | Status: DC | PRN

## 2021-02-26 NOTE — BH Assessment (Addendum)
Patient is to be admitted to [facility]U today 02/26/21 at 4:00pm by Dr. Toni Amend.  Attending Physician will be Dr.  Toni Amend .   Patient has been assigned to room 301, by [facility] Charge Nurse, Aundra Millet.  Call for report: [phone].    ER staff is aware of the admission:  Sue Lush, Patient Access.
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
## 2021-02-26 NOTE — Progress Notes (Addendum)
[facility] MD Progress Note  02/26/2021 1:18 PM Anna [name]  MRN:  [id number] Subjective:  Patient is a 50 year old female admitted under IVC from [facility] for MDD with Psychotic features with known psychiatric hx of MDD recurrent severe without psychosis, alcohol use disorder with dependence, borderline personality disorder, cannabis use disorder, cocaine use disorder, GAD, PTSD, and suicide attempt.  Chart Review, 24 hr Events: The patient's chart was reviewed and nursing notes were reviewed. The patient's case was discussed in multidisciplinary team meeting.  Per MAR: - Patient is generally compliant with scheduled meds.  - PRNs for agitation: ativan this morning Per RN notes, no documented behavioral issues and is not attending group. Patient slept, 6.75 hours  Patient seen and staffed with attending Dr. Sherron Flemings  Patient continues to be dysphoric and agitated this morning.  She reports that she is still depressed, down, and sad. She did receive a dose of ativan for anxiety this morning, prior to interview, and said this helped to calm her but she is tired now.  Patient makes minimal eye contact.  She reports that her sleep is "alright".  Reports having "alright" appetite.  Patient continues to have suicidal thoughts but denies homicidal thoughts.  Discussed plan with the patient to keep current medication regimen and transfer patient for ECT treatment to [facility].  Patient thanked this Clinical research associate and Dr. Sherron Flemings for helping aid with her treatment. Patient had no additional questions at this time.   Principal Problem: Severe recurrent major depressive disorder with psychotic features (HCC) Diagnosis: Principal Problem:   Severe recurrent major depressive disorder with psychotic features (HCC) Active Problems:   Borderline personality disorder (HCC)   Nicotine dependence with current use   GAD (generalized anxiety disorder)  Total Time spent with patient: 30 minutes including  medication management and psychotherapy  Past Psychiatric History: See H&P  Past Medical History:  Past Medical History:  Diagnosis Date   Anxiety    Borderline personality disorder in adult [facility])    Hearing deficit 07/13/2020   MDD (major depressive disorder)    History reviewed. No pertinent surgical history. Family History: History reviewed. No pertinent family history. Family Psychiatric  History: See H&P Social History:  Social History   Substance and Sexual Activity  Alcohol Use Not Currently     Social History   Substance and Sexual Activity  Drug Use Not Currently   Types: Cocaine, Marijuana    Social History   Socioeconomic History   Marital status: Single    Spouse name: Not on file   Number of children: Not on file   Years of education: Not on file   Highest education level: Not on file  Occupational History   Not on file  Tobacco Use   Smoking status: Some Days    Packs/day: 0.20    Years: 40.00    Pack years: 8.00    Types: Cigarettes   Smokeless tobacco: Never  Vaping Use   Vaping Use: Every day   Start date: 04/07/2020   Substances: Nicotine, Flavoring  Substance and Sexual Activity   Alcohol use: Not Currently   Drug use: Not Currently    Types: Cocaine, Marijuana   Sexual activity: Not on file  Other Topics Concern   Not on file  Social History Narrative   Not on file   Social Determinants of Health   Financial Resource Strain: Not on file  Food Insecurity: Not on file  Transportation Needs: Not on file  Physical Activity:  Not on file  Stress: Not on file  Social Connections: Not on file   Additional Social History:     Sleep: fair  Appetite:  Fair  Current Medications: Current Facility-Administered Medications  Medication Dose Route Frequency Provider Last Rate Last Admin   acetaminophen (TYLENOL) tablet 650 mg  650 mg Oral Q6H PRN Laveda Abbe, NP   650 mg at 02/21/21 1724   FLUoxetine (PROZAC) capsule 40 mg  40 mg  Oral Daily Park Pope, MD   40 mg at 02/26/21 0810   hydrOXYzine (ATARAX/VISTARIL) tablet 25 mg  25 mg Oral TID PRN Earney Navy, NP   25 mg at 02/25/21 2139   ibuprofen (ADVIL) tablet 600 mg  600 mg Oral Q6H PRN Laveda Abbe, NP   600 mg at 02/26/21 1194   melatonin tablet 5 mg  5 mg Oral QHS Dahlia Byes C, NP   5 mg at 02/25/21 2138   multivitamin with minerals tablet 1 tablet  1 tablet Oral Daily Sejal Cofield, Harrold Donath, MD   1 tablet at 02/26/21 0810   OLANZapine (ZYPREXA) tablet 12.5 mg  12.5 mg Oral Seabron Spates, MD   12.5 mg at 02/25/21 2137   And   OLANZapine (ZYPREXA) tablet 2.5 mg  2.5 mg Oral Daily Park Pope, MD   2.5 mg at 02/26/21 0810   OLANZapine zydis (ZYPREXA) disintegrating tablet 5 mg  5 mg Oral Q8H PRN Laveda Abbe, NP   5 mg at 02/23/21 1324   And   ziprasidone (GEODON) injection 20 mg  20 mg Intramuscular PRN Laveda Abbe, NP       polyethylene glycol (MIRALAX / GLYCOLAX) packet 17 g  17 g Oral Daily PRN Laveda Abbe, NP       prazosin (MINIPRESS) capsule 1 mg  1 mg Oral QHS Laveda Abbe, NP   1 mg at 02/24/21 2138   senna (SENOKOT) tablet 8.6 mg  1 tablet Oral Daily PRN Laveda Abbe, NP       thiamine tablet 100 mg  100 mg Oral Daily Maytal Mijangos, Harrold Donath, MD   100 mg at 02/26/21 1740    Lab Results:  Results for orders placed or performed during the hospital encounter of 02/08/21 (from the past 48 hour(s))  Basic metabolic panel     Status: Abnormal   Collection Time: 02/24/21  6:25 PM  Result Value Ref Range   Sodium 138 135 - 145 mmol/L   Potassium 4.2 3.5 - 5.1 mmol/L   Chloride 106 98 - 111 mmol/L   CO2 22 22 - 32 mmol/L   Glucose, Bld 111 (H) 70 - 99 mg/dL    Comment: Glucose reference range applies only to samples taken after fasting for at least 8 hours.   BUN 17 6 - 20 mg/dL   Creatinine, Ser 8.14 0.44 - 1.00 mg/dL   Calcium 9.8 8.9 - 48.1 mg/dL   GFR, Estimated >85 >63 mL/min    Comment:  (NOTE) Calculated using the CKD-EPI Creatinine Equation (2021)    Anion gap 10 5 - 15    Comment: Performed at [facility], [address], [place], [place] [address]     Blood Alcohol level:  Lab Results  Component Value Date   [facility] <10 02/08/2021   ETH <10 07/13/2020    Metabolic Disorder Labs: Lab Results  Component Value Date   HGBA1C 5.7 (H) 02/08/2021   MPG 116.89 02/08/2021   No results found  for: PROLACTIN Lab Results  Component Value Date   CHOL 222 (H) 02/11/2021   TRIG 200 (H) 02/11/2021   HDL 41 02/11/2021   CHOLHDL 5.4 02/11/2021   VLDL 40 02/11/2021   LDLCALC 141 (H) 02/11/2021   LDLCALC 187 (H) 02/08/2021    Physical Findings: CIWA:  CIWA-Ar Total: 0  Musculoskeletal: Strength & Muscle Tone: within normal limits Gait & Station: normal Patient leans: N/A  Psychiatric Specialty Exam:  Presentation  General Appearance: Well Groomed  Eye Contact: poor Speech:Clear and Coherent  Speech Volume:Increased  Handedness:Right   Mood and Affect  Mood: depressed   Affect: dysphoric, constricted   Thought Processes: ruminative and concrete  Orientation: full  Thought Content: fixated on her inability to improve her depression and anxiety, suicidal thoughts without plan.  Patient continues to be paranoid about others.  History of Schizophrenia/Schizoaffective disorder:No  Duration of Psychotic Symptoms:Greater than six months  Hallucinations: none reported  Ideas of Reference:None  Suicidal Thoughts: suicidal thoughts without plan  Homicidal Thoughts: none reported   Sensorium  Memory:Immediate Good  Judgment:Poor  Insight:Poor   Executive Functions  Concentration:Fair  Attention Span:Fair  Recall: fair  Fund of Knowledge:Fair  Language:Fair   Psychomotor Activity  Psychomotor Activity: normal   Assets  Assets:Leisure Time   Sleep  Sleep: fair  Physical Exam Vitals and nursing note  reviewed.  HENT:     Head: Normocephalic.  Pulmonary:     Effort: Pulmonary effort is normal.  Neurological:     General: No focal deficit present.     Mental Status: She is alert.   ROS: not assessed on this encounter Blood pressure 104/72, pulse 71, temperature (!) 97.5 F (36.4 C), temperature source Oral, resp. rate 18, height 5\' 8"  (1.727 m), weight 100.7 kg, SpO2 98 %. Body mass index is 33.75 kg/m.   Treatment Plan Summary: Daily contact with patient to assess and evaluate symptoms and progress in treatment and Medication management.  MDD, recurrent severe with psychotic features:  Patient still very pessimistic and depressed.  Patient has been accepted for ECT treatment at [facility] pending bed placement. -Continue Zyprexa 2.5 mg qam and 12.5 mg qhs for increasing paranoia, and for depression  -CONTINUE Prozac 40 mg for severe depression -CSW has placed ECT referral to [facility] via Dr. Toni Amend. Patient has been reviewed and plan for bed transfer to [facility] for ECT treatment has been made pending an open bed in [facility]. Greatly appreciate assistance from Dr. Toni Amend and [facility] CSW.    Anxiety:  -Continue Hydroxyzine 25 mg po tid as needed for anxiety. Propranolol was dc yesterday due to consistent low BP readings.    PTSD and Insomnia:  -Continue Melatonin 5 mg po at bed time for sleep.  -Continue Minipress 1 mg po qhs.   Agitation protocol:  Geodon 20 mg IM as needed for agitation Ativan 1 mg PO as needed for anxiety Zyprexa Zydis 5 mg PO every 8 hours as needed for agitation.   Continue other prn medications as ordered for other medical complaints.   Continue every 15 Minute safety checks Encourage participation on the therapeutic milieu and group therapy  Present discharge plan tentatively is [facility] for ECT treatment   Park Pope, MD 02/26/2021, 1:18 PM  Total Time Spent in Direct Patient Care:  I personally spent 20 minutes on the unit in direct patient care. The direct patient  care time included face-to-face time with the patient, reviewing the patient's chart, communicating with other professionals, and coordinating  care. Greater than 50% of this time was spent in counseling or coordinating care with the patient regarding goals of hospitalization, psycho-education, and discharge planning needs.  I have independently evaluated the patient during a face-to-face assessment on 02/26/21. I reviewed the patient's chart, and I participated in key portions of the service. I discussed the case with the [employer], and I agree with the assessment and plan of care as documented in the House Officer's note, as addended by me or notated below:  I directly edited the note, as above.    Phineas Inches, MD Psychiatrist

## 2021-02-26 NOTE — Progress Notes (Signed)
D: Pt A & O X 3. Denies SI, HI, AVH and pain at this time. D/C to [facility] ([facility]) as ordered for continued care. Picked up in lobby by [employer]. A: D/C instructions reviewed with pt including follow up care; compliance encouraged. All belongings from locker 02 returned to pt at time of departure. Scheduled and PRN medications given with verbal education and effects monitored. Safety checks maintained without incident till time of d/c.  R: Pt receptive to care. Compliant with medications when offered. Denies adverse drug reactions when assessed. Verbalized understanding related to d/c instructions. Signed belonging sheet in agreement with items received from locker. Ambulatory with a steady gait. Appears to be in no physical distress at time of departure.
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
## 2021-02-26 NOTE — Tx Team (Signed)
Initial Treatment Plan 02/26/2021 5:57 PM [name] [id number]    PATIENT STRESSORS: Financial difficulties   Health problems   Marital or family conflict     PATIENT STRENGTHS: Ability for insight  Average or above average intelligence  Communication skills  General fund of knowledge    PATIENT IDENTIFIED PROBLEMS: depression  anxiety   suicide risk                  DISCHARGE CRITERIA:  Ability to meet basic life and health needs Adequate post-discharge living arrangements Improved stabilization in mood, thinking, and/or behavior Need for constant or close observation no longer present Verbal commitment to aftercare and medication compliance  PRELIMINARY DISCHARGE PLAN: Outpatient therapy Return to previous living arrangement  PATIENT/FAMILY INVOLVEMENT: This treatment plan has been presented to and reviewed with the patient, Anna [name].  The patient has been given the opportunity to ask questions and make suggestions.  Chalmers Cater, RN 02/26/2021, 5:57 PM

## 2021-02-26 NOTE — Progress Notes (Signed)
  [facility] Adult Case Management Discharge Plan :  Will you be returning to the same living situation after discharge:  No. At discharge, do you have transportation home?: Yes,  Safe Transport  Do you have the ability to pay for your medications: Yes,  Medicare   Release of information consent forms completed and in the chart;  Patient's signature needed at discharge.  Patient to Follow up at:  Follow-up Information     [facility] Go on 03/05/2021.   Why: You have a hospital follow up appointment for therapy and medication management services on 03/05/21 at 10:00 am.  This appointment will be held in person.  *TRANSPORTATION WILL BE PROVIDED.  The provider will contact you prior to your appt with details. Contact information: [address] [place] [address] [phone]                 Next level of care provider has access to [facility] Link:yes  Safety Planning and Suicide Prevention discussed: Yes,  with patient and with friend      Has patient been referred to the Quitline?: Patient refused referral  Patient has been referred for addiction treatment: N/A  Aram Beecham, LCSWA 02/26/2021, 2:50 PM

## 2021-02-27 ENCOUNTER — Encounter: Payer: Self-pay | Admitting: Psychiatry

## 2021-02-27 ENCOUNTER — Other Ambulatory Visit: Payer: Self-pay

## 2021-02-27 DIAGNOSIS — F332 Major depressive disorder, recurrent severe without psychotic features: Principal | ICD-10-CM

## 2021-02-27 DIAGNOSIS — Z5181 Encounter for therapeutic drug level monitoring: Secondary | ICD-10-CM

## 2021-02-27 LAB — HEMOGLOBIN A1C
Hgb A1c MFr Bld: 5.5 % (ref 4.8–5.6)
Mean Plasma Glucose: 111.15 mg/dL

## 2021-02-27 MED ORDER — NICOTINE 21 MG/24HR TD PT24
21.0000 mg | MEDICATED_PATCH | Freq: Every day | TRANSDERMAL | Status: DC
  Filled 2021-02-27 (×2): qty 1

## 2021-02-27 NOTE — BH IP Treatment Plan (Addendum)
Interdisciplinary Treatment and Diagnostic Plan Update  02/27/2021 Time of Session: 9:30AM Anna [name] MRN: [id number]  Principal Diagnosis: Severe recurrent major depression without psychotic features [facility])  Secondary Diagnoses: Principal Problem:   Severe recurrent major depression without psychotic features ([place])   Current Medications:  Current Facility-Administered Medications  Medication Dose Route Frequency Provider Last Rate Last Admin   acetaminophen (TYLENOL) tablet 650 mg  650 mg Oral Q6H PRN Clapacs, Madie Reno, MD       FLUoxetine (PROZAC) capsule 40 mg  40 mg Oral Daily Clapacs, Madie Reno, MD   40 mg at 02/27/21 0809   hydrOXYzine (ATARAX/VISTARIL) tablet 25 mg  25 mg Oral TID PRN Clapacs, Madie Reno, MD       ibuprofen (ADVIL) tablet 600 mg  600 mg Oral Q6H PRN Clapacs, Madie Reno, MD       LORazepam (ATIVAN) tablet 1 mg  1 mg Oral Q6H PRN Clapacs, Madie Reno, MD       melatonin tablet 5 mg  5 mg Oral QHS Clapacs, John T, MD   5 mg at 02/26/21 2027   OLANZapine (ZYPREXA) tablet 12.5 mg  12.5 mg Oral QHS Clapacs, John T, MD   12.5 mg at 02/26/21 2026   And   OLANZapine (ZYPREXA) tablet 2.5 mg  2.5 mg Oral Daily Clapacs, John T, MD   2.5 mg at 02/27/21 0809   OLANZapine zydis (ZYPREXA) disintegrating tablet 5 mg  5 mg Oral Q8H PRN Clapacs, Madie Reno, MD       And   ziprasidone (GEODON) injection 20 mg  20 mg Intramuscular PRN Clapacs, Madie Reno, MD       polyethylene glycol (MIRALAX / GLYCOLAX) packet 17 g  17 g Oral Daily PRN Clapacs, Madie Reno, MD       prazosin (MINIPRESS) capsule 1 mg  1 mg Oral QHS Clapacs, John T, MD   1 mg at 02/26/21 2027   senna (SENOKOT) tablet 8.6 mg  1 tablet Oral Daily PRN Clapacs, Madie Reno, MD       PTA Medications: Medications Prior to Admission  Medication Sig Dispense Refill Last Dose   FLUoxetine (PROZAC) 40 MG capsule Take 1 capsule (40 mg total) by mouth daily.  3    hydrOXYzine (ATARAX/VISTARIL) 25 MG tablet Take 1 tablet (25 mg total) by mouth 3 (three) times  daily as needed for anxiety. 30 tablet 0    prazosin (MINIPRESS) 1 MG capsule Take 1 capsule (1 mg total) by mouth at bedtime.       Patient Stressors: Financial difficulties   Health problems   Marital or family conflict    Patient Strengths: Ability for insight  Average or above average intelligence  Communication skills  General fund of knowledge   Treatment Modalities: Medication Management, Group therapy, Case management,  1 to 1 session with clinician, Psychoeducation, Recreational therapy.   Physician Treatment Plan for Primary Diagnosis: Severe recurrent major depression without psychotic features ([place]) Long Term Goal(s):     Short Term Goals:    Medication Management: Evaluate patient's response, side effects, and tolerance of medication regimen.  Therapeutic Interventions: 1 to 1 sessions, Unit Group sessions and Medication administration.  Evaluation of Outcomes: Not Met  Physician Treatment Plan for Secondary Diagnosis: Principal Problem:   Severe recurrent major depression without psychotic features ([place])  Long Term Goal(s):     Short Term Goals:       Medication Management: Evaluate patient's response, side effects, and tolerance of medication  regimen.  Therapeutic Interventions: 1 to 1 sessions, Unit Group sessions and Medication administration.  Evaluation of Outcomes: Not Met   RN Treatment Plan for Primary Diagnosis: Severe recurrent major depression without psychotic features ([place]) Long Term Goal(s): Knowledge of disease and therapeutic regimen to maintain health will improve  Short Term Goals: Ability to demonstrate self-control, Ability to participate in decision making will improve, Ability to verbalize feelings will improve, Ability to disclose and discuss suicidal ideas, Ability to identify and develop effective coping behaviors will improve, and Compliance with prescribed medications will improve  Medication Management: RN will administer  medications as ordered by provider, will assess and evaluate patient's response and provide education to patient for prescribed medication. RN will report any adverse and/or side effects to prescribing provider.  Therapeutic Interventions: 1 on 1 counseling sessions, Psychoeducation, Medication administration, Evaluate responses to treatment, Monitor vital signs and CBGs as ordered, Perform/monitor CIWA, COWS, AIMS and Fall Risk screenings as ordered, Perform wound care treatments as ordered.  Evaluation of Outcomes: Not Met   LCSW Treatment Plan for Primary Diagnosis: Severe recurrent major depression without psychotic features ([place]) Long Term Goal(s): Safe transition to appropriate next level of care at discharge, Engage patient in therapeutic group addressing interpersonal concerns.  Short Term Goals: Engage patient in aftercare planning with referrals and resources, Increase social support, Increase ability to appropriately verbalize feelings, Increase emotional regulation, Facilitate acceptance of mental health diagnosis and concerns, Facilitate patient progression through stages of change regarding substance use diagnoses and concerns, Identify triggers associated with mental health/substance abuse issues, and Increase skills for wellness and recovery  Therapeutic Interventions: Assess for all discharge needs, 1 to 1 time with Social worker, Explore available resources and support systems, Assess for adequacy in community support network, Educate family and significant other(s) on suicide prevention, Complete Psychosocial Assessment, Interpersonal group therapy.  Evaluation of Outcomes: Not Met   Progress in Treatment: Attending groups: No. Participating in groups: No. Taking medication as prescribed: Yes. Toleration medication: Yes. Family/Significant other contact made: No, will contact:  once permission is given Patient understands diagnosis: Yes. Discussing patient identified  problems/goals with staff: Yes. Medical problems stabilized or resolved: Yes. Denies suicidal/homicidal ideation: Yes. Issues/concerns per patient self-inventory: No. Other: none  New problem(s) identified: No, Describe:  none  New Short Term/Long Term Goal(s): elimination of symptoms of psychosis, medication management for mood stabilization; elimination of SI thoughts; development of comprehensive mental wellness/sobriety plan.   Patient Goals:  "I came here for ECT"  Discharge Plan or Barriers: CSW will assist the patient in developing discharge plans.    Reason for Continuation of Hospitalization: Anxiety Depression Medication stabilization Suicidal ideation  Estimated Length of Stay:  1-7 days   Scribe for Treatment Team: Rozann Lesches, Marlinda Mike 02/27/2021 10:22 AM

## 2021-02-27 NOTE — Progress Notes (Signed)
Upon doing q15 minute safety checks, this writer walked into patient's room and asked patient if she was ok and she shook her head "no". This Clinical research associate asked patient what's wrong and she stated "I'm just tired of hearing people laughing at people's situations like mine". This Clinical research associate asked patient if it was staff or other patients that she was referring to, and she stated "I don't know, but it doesn't matter, because they aren't going to stop". This Clinical research associate is unsure if patient has heard other people laughing, or if she is hallucinating.

## 2021-02-27 NOTE — [facility] Counselor (Signed)
CSW attempted to complete the PSA with the patient.  Patient was upset at the questions and stated that CSW was making them up.    CSW attempted to review the patient's previous assessment, pt became agitated at this.  CSW offered to complete a blank assessment and pt became agitated at this as well.  CSW will attempt to complete at a later time.  Penni Homans, MSW, LCSW 02/27/2021 2:23 PM

## 2021-02-27 NOTE — Plan of Care (Signed)
  Problem: Education: Goal: Emotional status will improve Outcome: Not Progressing   Problem: Activity: Goal: Interest or engagement in activities will improve Outcome: Not Progressing   

## 2021-02-27 NOTE — Plan of Care (Signed)
D- Patient alert and oriented. Patient presents in a depressed, but pleasant mood on assessment stating that she slept "ok, but it was cold" last night. Patient had no complaints to voice to this Clinical research associate. Patient endorsed passive SI, without a plan. Patient also reports depression and anxiety, but could not go into detail as to why she feels this way, other than stating she's always been this way and that nobody cares about her. Patient denies HI, AVH, and pain at this time. Patient's goal for today is to "find out about starting ECT", in which she will "talk to the doctor" in order to achieve her goal.  A- Scheduled medications administered to patient, per MD orders. Support and encouragement provided. Routine safety checks conducted every 15 minutes. Patient informed to notify staff with problems or concerns.  R- No adverse drug reactions noted. Patient contracts for safety at this time. Patient compliant with medications and treatment plan. Patient receptive, calm, and cooperative. Patient remains safe at this time.  Problem: Education: Goal: Knowledge of  General Education information/materials will improve Outcome: Not Progressing Goal: Emotional status will improve Outcome: Not Progressing Goal: Mental status will improve Outcome: Not Progressing Goal: Verbalization of understanding the information provided will improve Outcome: Not Progressing   Problem: Activity: Goal: Interest or engagement in activities will improve Outcome: Not Progressing Goal: Sleeping patterns will improve Outcome: Not Progressing   Problem: Coping: Goal: Ability to verbalize frustrations and anger appropriately will improve Outcome: Not Progressing Goal: Ability to demonstrate self-control will improve Outcome: Not Progressing   Problem: Health Behavior/Discharge Planning: Goal: Identification of resources available to assist in meeting health care needs will improve Outcome: Not  Progressing Goal: Compliance with treatment plan for underlying cause of condition will improve Outcome: Not Progressing   Problem: Physical Regulation: Goal: Ability to maintain clinical measurements within normal limits will improve Outcome: Not Progressing   Problem: Safety: Goal: Periods of time without injury will increase Outcome: Not Progressing   Problem: Education: Goal: Ability to state activities that reduce stress will improve Outcome: Not Progressing   Problem: Coping: Goal: Ability to identify and develop effective coping behavior will improve Outcome: Not Progressing   Problem: Self-Concept: Goal: Ability to identify factors that promote anxiety will improve Outcome: Not Progressing Goal: Level of anxiety will decrease Outcome: Not Progressing Goal: Ability to modify response to factors that promote anxiety will improve Outcome: Not Progressing   Problem: Education: Goal: Utilization of techniques to improve thought processes will improve Outcome: Not Progressing Goal: Knowledge of the prescribed therapeutic regimen will improve Outcome: Not Progressing   Problem: Activity: Goal: Interest or engagement in leisure activities will improve Outcome: Not Progressing Goal: Imbalance in normal sleep/wake cycle will improve Outcome: Not Progressing   Problem: Coping: Goal: Coping ability will improve Outcome: Not Progressing Goal: Will verbalize feelings Outcome: Not Progressing   Problem: Health Behavior/Discharge Planning: Goal: Ability to make decisions will improve Outcome: Not Progressing Goal: Compliance with therapeutic regimen will improve Outcome: Not Progressing   Problem: Role Relationship: Goal: Will demonstrate positive changes in social behaviors and relationships Outcome: Not Progressing   Problem: Safety: Goal: Ability to disclose and discuss suicidal ideas will improve Outcome: Not Progressing Goal: Ability to identify and utilize  support systems that promote safety will improve Outcome: Not Progressing   Problem: Self-Concept: Goal: Will verbalize positive feelings about self Outcome: Not Progressing Goal: Level of anxiety will decrease Outcome: Not Progressing   Problem: Education: Goal: Ability to make informed decisions  regarding treatment will improve Outcome: Not Progressing   Problem: Coping: Goal: Coping ability will improve Outcome: Not Progressing   Problem: Health Behavior/Discharge Planning: Goal: Identification of resources available to assist in meeting health care needs will improve Outcome: Not Progressing   Problem: Medication: Goal: Compliance with prescribed medication regimen will improve Outcome: Not Progressing   Problem: Self-Concept: Goal: Ability to disclose and discuss suicidal ideas will improve Outcome: Not Progressing Goal: Will verbalize positive feelings about self Outcome: Not Progressing
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
## 2021-02-27 NOTE — Progress Notes (Signed)
Recreation Therapy Notes  Date: 02/27/2021  Time: 10:30am    Location:  C[facility] room    Behavioral response: N/A   Intervention Topic: Time management    Discussion/Intervention: Patient unable to attend group.  Clinical Observations/Feedback:  Patient unable to attend group.   [name] LRT/CTRS       Anna [name] 02/27/2021 12:10 PM

## 2021-02-27 NOTE — Group Note (Signed)
[facility] LCSW Group Therapy Note   Group Date: 02/27/2021 Start Time: 1310 End Time: 1400   Type of Therapy/Topic:  Group Therapy:  Emotion Regulation  Participation Level:  None   Mood:  Description of Group:    The purpose of this group is to assist patients in learning to regulate negative emotions and experience positive emotions. Patients will be guided to discuss ways in which they have been vulnerable to their negative emotions. These vulnerabilities will be juxtaposed with experiences of positive emotions or situations, and patients challenged to use positive emotions to combat negative ones. Special emphasis will be placed on coping with negative emotions in conflict situations, and patients will process healthy conflict resolution skills.  Therapeutic Goals: Patient will identify two positive emotions or experiences to reflect on in order to balance out negative emotions:  Patient will label two or more emotions that they find the most difficult to experience:  Patient will be able to demonstrate positive conflict resolution skills through discussion or role plays:   Summary of Patient Progress: Patient was in the group from approximately ten minutes before walking out. During time in group, she did not participate in the discussion.   Therapeutic Modalities:   Cognitive Behavioral Therapy Feelings Identification Dialectical Behavioral Therapy   Glenis Smoker, LCSW

## 2021-02-27 NOTE — Progress Notes (Signed)
Recreation Therapy Notes  INPATIENT RECREATION TR PLAN  Patient Details Name: Anna [name] MRN: [id number] DOB: [birth year] Today's Date: 02/27/2021  Rec Therapy Plan Is patient appropriate for Therapeutic Recreation?: Yes Treatment times per week: at least 3 Estimated Length of Stay: 5-7 days TR Treatment/Interventions: Group participation (Comment)  Discharge Criteria Pt will be discharged from therapy if:: Discharged Treatment plan/goals/alternatives discussed and agreed upon by:: Patient/family  Discharge Summary     Anna [name] 02/27/2021, 3:14 PM

## 2021-02-27 NOTE — Progress Notes (Signed)
Patient isolative to self and room. Minimal interaction with staff, no interaction with peers. Patient forwarded minimal information with with this writer states "she answered all the questions with the other nurse earlier and she was tired." Patient did take medications, did not come out for snack. Encouragement and support provided. Safety checks maintained. Medications given as prescribed. Pt receptive and remains safe on unit with q 15 min checks.Marland Kitchen
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
## 2021-02-27 NOTE — Progress Notes (Signed)
Recreation Therapy Notes  INPATIENT RECREATION THERAPY ASSESSMENT  Patient Details Name: Anna [name] MRN: [id number] DOB: Jul 02, 1970 Today's Date: 02/27/2021       Information Obtained From: Patient (Patient refused)  Able to Participate in Assessment/Interview: Yes  Patient Presentation: Resistant, Lethargic  Reason for Admission (Per Patient):    Patient Stressors:    Coping Skills:      Leisure Interests (2+):     Frequency of Recreation/Participation:    Awareness of Community Resources:     [employer]:     Current Use:    If no, Barriers?:    Expressed Interest in [employer] Information:    [place] of Residence:     Patient Main Form of Transportation:    Patient Strengths:     Patient Identified Areas of Improvement:     Patient Goal for Hospitalization:     Current SI (including self-harm):     Current HI:     Current AVH:    Staff Intervention Plan:    Consent to Intern Participation:    Reagann Dolce 02/27/2021, 3:13 PM

## 2021-02-27 NOTE — Progress Notes (Signed)
Patient requested medication for agitation. Patient stated to this writer that she can't take loud noises and all of the slamming doors are bothering her. PRN Zyprexa was given to patient to help with her agitation.

## 2021-02-28 NOTE — [facility] Counselor (Signed)
CSW made second attempt to complete assessment with pt. Pt could not be roused from sleep. CSW will follow up with pt to complete assessment at a more opportune time.   Verdell Kincannon [place], MSW, LCSW-A 11/24/20222:04 PM

## 2021-02-28 NOTE — Progress Notes (Signed)
Patient has remained isolative to room and to self. Mood is sad. Affect is flat. When asked if she hears voices she says "I'm not sure". When asked if she thought she heard people talking about her she said "I know they are". Reports passive SI with no plan but contracts for safety. Medication compliant

## 2021-02-28 NOTE — Plan of Care (Signed)
  Problem: Education: Goal: Knowledge of [facility] Education information/materials will improve Outcome: Progressing Goal: Emotional status will improve Outcome: Progressing Goal: Mental status will improve Outcome: Progressing Goal: Verbalization of understanding the information provided will improve Outcome: Progressing   Problem: Activity: Goal: Interest or engagement in activities will improve Outcome: Progressing Goal: Sleeping patterns will improve Outcome: Progressing   Problem: Coping: Goal: Ability to verbalize frustrations and anger appropriately will improve Outcome: Progressing Goal: Ability to demonstrate self-control will improve Outcome: Progressing   Problem: Health Behavior/Discharge Planning: Goal: Identification of resources available to assist in meeting health care needs will improve Outcome: Progressing Goal: Compliance with treatment plan for underlying cause of condition will improve Outcome: Progressing   Problem: Physical Regulation: Goal: Ability to maintain clinical measurements within normal limits will improve Outcome: Progressing   Problem: Safety: Goal: Periods of time without injury will increase Outcome: Progressing   Problem: Education: Goal: Ability to state activities that reduce stress will improve Outcome: Progressing   Problem: Coping: Goal: Ability to identify and develop effective coping behavior will improve Outcome: Progressing   Problem: Self-Concept: Goal: Ability to identify factors that promote anxiety will improve Outcome: Progressing Goal: Level of anxiety will decrease Outcome: Progressing Goal: Ability to modify response to factors that promote anxiety will improve Outcome: Progressing   Problem: Education: Goal: Utilization of techniques to improve thought processes will improve Outcome: Progressing Goal: Knowledge of the prescribed therapeutic regimen will improve Outcome: Progressing   Problem:  Activity: Goal: Interest or engagement in leisure activities will improve Outcome: Progressing Goal: Imbalance in normal sleep/wake cycle will improve Outcome: Progressing   Problem: Coping: Goal: Coping ability will improve Outcome: Progressing Goal: Will verbalize feelings Outcome: Progressing   Problem: Health Behavior/Discharge Planning: Goal: Ability to make decisions will improve Outcome: Progressing Goal: Compliance with therapeutic regimen will improve Outcome: Progressing   Problem: Role Relationship: Goal: Will demonstrate positive changes in social behaviors and relationships Outcome: Progressing   Problem: Safety: Goal: Ability to disclose and discuss suicidal ideas will improve Outcome: Progressing Goal: Ability to identify and utilize support systems that promote safety will improve Outcome: Progressing   Problem: Self-Concept: Goal: Will verbalize positive feelings about self Outcome: Progressing Goal: Level of anxiety will decrease Outcome: Progressing   Problem: Education: Goal: Ability to make informed decisions regarding treatment will improve Outcome: Progressing   Problem: Coping: Goal: Coping ability will improve Outcome: Progressing   Problem: Health Behavior/Discharge Planning: Goal: Identification of resources available to assist in meeting health care needs will improve Outcome: Progressing   Problem: Medication: Goal: Compliance with prescribed medication regimen will improve Outcome: Progressing   Problem: Self-Concept: Goal: Ability to disclose and discuss suicidal ideas will improve Outcome: Progressing Goal: Will verbalize positive feelings about self Outcome: Progressing   

## 2021-02-28 NOTE — Progress Notes (Signed)
Patient observed out of her room a little bit more. Still minimally interactive and isolates to self. Continues to report passive SI but is able to contract for safety. Denies HI and AVH

## 2021-02-28 NOTE — Progress Notes (Signed)
[facility] MD Progress Note  02/28/2021 10:48 AM Anna [name]  MRN:  [id number] Subjective: Anna [name] is still depressed.  She is taking her medications as prescribed and denies any side effects.  She states that she slept okay last night.  She is looking forward to receiving ECT. Principal Problem: Severe recurrent major depression without psychotic features (HCC) Diagnosis: Principal Problem:   Severe recurrent major depression without psychotic features (HCC) Active Problems:   Cocaine use disorder, severe, dependence (HCC)   Degenerative lumbar disc   Nicotine dependence with current use   PTSD (post-traumatic stress disorder)  Total Time spent with patient: 15 minutes  Past Psychiatric History: Unremarkable  Past Medical History:  Past Medical History:  Diagnosis Date   Anxiety    Borderline personality disorder in adult [facility])    Hearing deficit 07/13/2020   MDD (major depressive disorder)    History reviewed. No pertinent surgical history. Family History: History reviewed. No pertinent family history. Family Psychiatric  History: Unremarkable Social History:  Social History   Substance and Sexual Activity  Alcohol Use Not Currently     Social History   Substance and Sexual Activity  Drug Use Not Currently   Types: Cocaine, Marijuana    Social History   Socioeconomic History   Marital status: Single    Spouse name: Not on file   Number of children: Not on file   Years of education: Not on file   Highest education level: Not on file  Occupational History   Not on file  Tobacco Use   Smoking status: Some Days    Packs/day: 0.20    Years: 40.00    Pack years: 8.00    Types: Cigarettes   Smokeless tobacco: Never  Vaping Use   Vaping Use: Every day   Start date: 04/07/2020   Substances: Nicotine, Flavoring  Substance and Sexual Activity   Alcohol use: Not Currently   Drug use: Not Currently    Types: Cocaine, Marijuana   Sexual activity: Not Currently    Birth  control/protection: None  Other Topics Concern   Not on file  Social History Narrative   Not on file   Social Determinants of Health   Financial Resource Strain: Not on file  Food Insecurity: Not on file  Transportation Needs: Not on file  Physical Activity: Not on file  Stress: Not on file  Social Connections: Not on file   Additional Social History:                         Sleep: Fair  Appetite:  Fair  Current Medications: Current Facility-Administered Medications  Medication Dose Route Frequency Provider Last Rate Last Admin   acetaminophen (TYLENOL) tablet 650 mg  650 mg Oral Q6H PRN Clapacs, John T, MD       FLUoxetine (PROZAC) capsule 40 mg  40 mg Oral Daily Clapacs, John T, MD   40 mg at 02/28/21 0755   hydrOXYzine (ATARAX/VISTARIL) tablet 25 mg  25 mg Oral TID PRN Clapacs, Jackquline Denmark, MD       ibuprofen (ADVIL) tablet 600 mg  600 mg Oral Q6H PRN Clapacs, John T, MD   600 mg at 02/27/21 1244   melatonin tablet 5 mg  5 mg Oral QHS Clapacs, John T, MD   5 mg at 02/27/21 2148   nicotine (NICODERM CQ - dosed in mg/24 hours) patch 21 mg  21 mg Transdermal Daily Clapacs, Jackquline Denmark, MD  OLANZapine (ZYPREXA) tablet 12.5 mg  12.5 mg Oral QHS Clapacs, John T, MD   12.5 mg at 02/27/21 2148   And   OLANZapine (ZYPREXA) tablet 2.5 mg  2.5 mg Oral Daily Clapacs, John T, MD   2.5 mg at 02/28/21 0755   OLANZapine zydis (ZYPREXA) disintegrating tablet 5 mg  5 mg Oral Q8H PRN Clapacs, Jackquline Denmark, MD   5 mg at 02/27/21 1452   And   ziprasidone (GEODON) injection 20 mg  20 mg Intramuscular PRN Clapacs, Jackquline Denmark, MD       polyethylene glycol (MIRALAX / GLYCOLAX) packet 17 g  17 g Oral Daily PRN Clapacs, Jackquline Denmark, MD       prazosin (MINIPRESS) capsule 1 mg  1 mg Oral QHS Clapacs, John T, MD   1 mg at 02/27/21 2149   senna (SENOKOT) tablet 8.6 mg  1 tablet Oral Daily PRN Clapacs, Jackquline Denmark, MD        Lab Results:  Results for orders placed or performed during the hospital encounter of  02/26/21 (from the past 48 hour(s))  Hemoglobin A1c     Status: None   Collection Time: 02/27/21  6:52 AM  Result Value Ref Range   Hgb A1c MFr Bld 5.5 4.8 - 5.6 %    Comment: (NOTE) Pre diabetes:          5.7%-6.4%  Diabetes:              >6.4%  Glycemic control for   <7.0% adults with diabetes    Mean Plasma Glucose 111.15 mg/dL    Comment: Performed at [facility], [address] [place], [place] [address]    Blood Alcohol level:  Lab Results  Component Value Date   [facility] <10 02/08/2021   ETH <10 07/13/2020    Metabolic Disorder Labs: Lab Results  Component Value Date   HGBA1C 5.5 02/27/2021   MPG 111.15 02/27/2021   MPG 116.89 02/08/2021   No results found for: PROLACTIN Lab Results  Component Value Date   CHOL 222 (H) 02/11/2021   TRIG 200 (H) 02/11/2021   HDL 41 02/11/2021   CHOLHDL 5.4 02/11/2021   VLDL 40 02/11/2021   LDLCALC 141 (H) 02/11/2021   LDLCALC 187 (H) 02/08/2021    Physical Findings: AIMS:  , ,  ,  ,    CIWA:    COWS:     Musculoskeletal: Strength & Muscle Tone: within normal limits Gait & Station: normal Patient leans: N/A  Psychiatric Specialty Exam:  Presentation  General Appearance: Disheveled (poorly groomed)  Eye Contact:Fleeting  Speech:Slow  Speech Volume:Decreased  Handedness:Right   Mood and Affect  Mood:Anxious; Depressed; Dysphoric; Hopeless; Worthless  Affect:Congruent; Constricted   Thought Process  Thought Processes:Linear  Descriptions of Associations:Intact  Orientation:Full (Time, Place and Person)  Thought Content:Paranoid Ideation  History of Schizophrenia/Schizoaffective disorder:No  Duration of Psychotic Symptoms:N/A  Hallucinations:No data recorded Ideas of Reference:Paranoia  Suicidal Thoughts:No data recorded Homicidal Thoughts:No data recorded  Sensorium  Memory:Immediate Fair; Recent Fair; Remote Fair  Judgment:Poor  Insight:Poor   Executive Functions   Concentration:Poor  Attention Span:Poor  Recall:Poor  Fund of Knowledge:Fair  Language:Fair   Psychomotor Activity  Psychomotor Activity:No data recorded  Assets  Assets:Leisure Time   Sleep  Sleep:No data recorded   Physical Exam: Physical Exam Constitutional:      Appearance: Normal appearance.  HENT:     Head: Normocephalic and atraumatic.     Mouth/Throat:  Pharynx: Oropharynx is clear.  Eyes:     Pupils: Pupils are equal, round, and reactive to light.  Cardiovascular:     Rate and Rhythm: Normal rate and regular rhythm.  Pulmonary:     Effort: Pulmonary effort is normal.     Breath sounds: Normal breath sounds.  Abdominal:     General: Abdomen is flat.     Palpations: Abdomen is soft.  Musculoskeletal:        General: Normal range of motion.  Skin:    General: Skin is warm and dry.  Neurological:     General: No focal deficit present.     Mental Status: She is alert. Mental status is at baseline.  Psychiatric:        Mood and Affect: Mood is anxious. Affect is flat.        Speech: Speech normal.        Behavior: Behavior is cooperative.        Thought Content: Thought content normal.        Cognition and Memory: Cognition and memory normal.        Judgment: Judgment normal.   Review of Systems  Constitutional: Negative.   HENT: Negative.    Eyes: Negative.   Respiratory: Negative.    Cardiovascular: Negative.   Gastrointestinal: Negative.   Genitourinary: Negative.   Musculoskeletal: Negative.   Skin: Negative.   Neurological: Negative.   Endo/Heme/Allergies: Negative.   Psychiatric/Behavioral:  Positive for depression.   Blood pressure 126/85, pulse 80, temperature 98.3 F (36.8 C), temperature source Oral, resp. rate 18, height 5\' 8"  (1.727 m), weight 103.9 kg, SpO2 97 %. Body mass index is 34.82 kg/m.   Treatment Plan Summary: Daily contact with patient to assess and evaluate symptoms and progress in treatment, Medication  management, and Plan continue current medications.  , DO 02/28/2021, 10:48 AM

## 2021-02-28 NOTE — Plan of Care (Signed)
Patient isolates in her room most of the shift. Appears with a sad affect states " I am depressed all my life. I don't think anything can help me." Patient was little hesitant to talk first but opened up little. Patient stated that she is having 6 children and they are all angry with her because she did not raise them up. Patient denies SI,HI and AVH at this time.Appetite and energy level good.Patient looking forward for ECT. Support and encouragement given.

## 2021-02-28 NOTE — Plan of Care (Signed)
  Problem: Education: Goal: Knowledge of [facility] Education information/materials will improve Outcome: Progressing Goal: Emotional status will improve Outcome: Progressing Goal: Mental status will improve Outcome: Progressing Goal: Verbalization of understanding the information provided will improve Outcome: Progressing   Problem: Activity: Goal: Interest or engagement in activities will improve Outcome: Progressing Goal: Sleeping patterns will improve Outcome: Progressing   Problem: Coping: Goal: Ability to verbalize frustrations and anger appropriately will improve Outcome: Progressing Goal: Ability to demonstrate self-control will improve Outcome: Progressing   Problem: Health Behavior/Discharge Planning: Goal: Identification of resources available to assist in meeting health care needs will improve Outcome: Progressing Goal: Compliance with treatment plan for underlying cause of condition will improve Outcome: Progressing   Problem: Physical Regulation: Goal: Ability to maintain clinical measurements within normal limits will improve Outcome: Progressing   Problem: Safety: Goal: Periods of time without injury will increase Outcome: Progressing   Problem: Education: Goal: Ability to state activities that reduce stress will improve Outcome: Progressing   Problem: Coping: Goal: Ability to identify and develop effective coping behavior will improve Outcome: Progressing   Problem: Self-Concept: Goal: Ability to identify factors that promote anxiety will improve Outcome: Progressing Goal: Level of anxiety will decrease Outcome: Progressing Goal: Ability to modify response to factors that promote anxiety will improve Outcome: Progressing   Problem: Education: Goal: Utilization of techniques to improve thought processes will improve Outcome: Progressing Goal: Knowledge of the prescribed therapeutic regimen will improve Outcome: Progressing   Problem:  Activity: Goal: Interest or engagement in leisure activities will improve Outcome: Progressing Goal: Imbalance in normal sleep/wake cycle will improve Outcome: Progressing   Problem: Coping: Goal: Coping ability will improve Outcome: Progressing Goal: Will verbalize feelings Outcome: Progressing   Problem: Health Behavior/Discharge Planning: Goal: Ability to make decisions will improve Outcome: Progressing Goal: Compliance with therapeutic regimen will improve Outcome: Progressing   Problem: Role Relationship: Goal: Will demonstrate positive changes in social behaviors and relationships Outcome: Progressing   Problem: Safety: Goal: Ability to disclose and discuss suicidal ideas will improve Outcome: Progressing Goal: Ability to identify and utilize support systems that promote safety will improve Outcome: Progressing   Problem: Self-Concept: Goal: Will verbalize positive feelings about self Outcome: Progressing Goal: Level of anxiety will decrease Outcome: Progressing   Problem: Education: Goal: Ability to make informed decisions regarding treatment will improve Outcome: Progressing   Problem: Coping: Goal: Coping ability will improve Outcome: Progressing   Problem: Health Behavior/Discharge Planning: Goal: Identification of resources available to assist in meeting health care needs will improve Outcome: Progressing   Problem: Medication: Goal: Compliance with prescribed medication regimen will improve Outcome: Progressing   Problem: Self-Concept: Goal: Ability to disclose and discuss suicidal ideas will improve Outcome: Progressing Goal: Will verbalize positive feelings about self Outcome: Progressing   

## 2021-03-01 NOTE — Plan of Care (Signed)
  Problem: Education: Goal: Knowledge of [facility] Education information/materials will improve Outcome: Progressing Goal: Emotional status will improve Outcome: Progressing Goal: Mental status will improve Outcome: Progressing Goal: Verbalization of understanding the information provided will improve Outcome: Progressing   Problem: Activity: Goal: Interest or engagement in activities will improve Outcome: Progressing Goal: Sleeping patterns will improve Outcome: Progressing   Problem: Coping: Goal: Ability to verbalize frustrations and anger appropriately will improve Outcome: Progressing Goal: Ability to demonstrate self-control will improve Outcome: Progressing   Problem: Health Behavior/Discharge Planning: Goal: Identification of resources available to assist in meeting health care needs will improve Outcome: Progressing Goal: Compliance with treatment plan for underlying cause of condition will improve Outcome: Progressing   Problem: Physical Regulation: Goal: Ability to maintain clinical measurements within normal limits will improve Outcome: Progressing   Problem: Safety: Goal: Periods of time without injury will increase Outcome: Progressing   Problem: Education: Goal: Ability to state activities that reduce stress will improve Outcome: Progressing   Problem: Coping: Goal: Ability to identify and develop effective coping behavior will improve Outcome: Progressing   Problem: Self-Concept: Goal: Ability to identify factors that promote anxiety will improve Outcome: Progressing Goal: Level of anxiety will decrease Outcome: Progressing Goal: Ability to modify response to factors that promote anxiety will improve Outcome: Progressing   Problem: Education: Goal: Utilization of techniques to improve thought processes will improve Outcome: Progressing Goal: Knowledge of the prescribed therapeutic regimen will improve Outcome: Progressing   Problem:  Activity: Goal: Interest or engagement in leisure activities will improve Outcome: Progressing Goal: Imbalance in normal sleep/wake cycle will improve Outcome: Progressing   Problem: Coping: Goal: Coping ability will improve Outcome: Progressing Goal: Will verbalize feelings Outcome: Progressing   Problem: Health Behavior/Discharge Planning: Goal: Ability to make decisions will improve Outcome: Progressing Goal: Compliance with therapeutic regimen will improve Outcome: Progressing   Problem: Role Relationship: Goal: Will demonstrate positive changes in social behaviors and relationships Outcome: Progressing   Problem: Safety: Goal: Ability to disclose and discuss suicidal ideas will improve Outcome: Progressing Goal: Ability to identify and utilize support systems that promote safety will improve Outcome: Progressing   Problem: Self-Concept: Goal: Will verbalize positive feelings about self Outcome: Progressing Goal: Level of anxiety will decrease Outcome: Progressing   Problem: Education: Goal: Ability to make informed decisions regarding treatment will improve Outcome: Progressing   Problem: Coping: Goal: Coping ability will improve Outcome: Progressing   Problem: Health Behavior/Discharge Planning: Goal: Identification of resources available to assist in meeting health care needs will improve Outcome: Progressing   Problem: Medication: Goal: Compliance with prescribed medication regimen will improve Outcome: Progressing   Problem: Self-Concept: Goal: Ability to disclose and discuss suicidal ideas will improve Outcome: Progressing Goal: Will verbalize positive feelings about self Outcome: Progressing   

## 2021-03-01 NOTE — Plan of Care (Signed)
Patient is alert oriented and cooperative but guarded. Denies pain or discomfort at this time. Verbalize anxiety rating 10/10 but received anxiety medication. Verbalize depression and suicidal thoughts with no plan, but contract for safety. Denies HI and AVH.  Takes all due medications except nicotine patch patient stated " I've been telling them I don't need the patch". Ate breakfast with good appetite. Remain safe on the unit with Q 15 minute safety check.  Problem: Education: Goal: Knowledge of [facility] Education information/materials will improve Outcome: Progressing Goal: Emotional status will improve Outcome: Progressing Goal: Mental status will improve Outcome: Progressing Goal: Verbalization of understanding the information provided will improve Outcome: Progressing   Problem: Activity: Goal: Interest or engagement in activities will improve Outcome: Progressing Goal: Sleeping patterns will improve Outcome: Progressing   Problem: Coping: Goal: Ability to verbalize frustrations and anger appropriately will improve Outcome: Progressing Goal: Ability to demonstrate self-control will improve Outcome: Progressing   Problem: Health Behavior/Discharge Planning: Goal: Identification of resources available to assist in meeting health care needs will improve Outcome: Progressing Goal: Compliance with treatment plan for underlying cause of condition will improve Outcome: Progressing   Problem: Physical Regulation: Goal: Ability to maintain clinical measurements within normal limits will improve Outcome: Progressing   Problem: Safety: Goal: Periods of time without injury will increase Outcome: Progressing   Problem: Education: Goal: Ability to state activities that reduce stress will improve Outcome: Progressing   Problem: Coping: Goal: Ability to identify and develop effective coping behavior will improve Outcome: Progressing   Problem: Self-Concept: Goal:  Ability to identify factors that promote anxiety will improve Outcome: Progressing Goal: Level of anxiety will decrease Outcome: Progressing Goal: Ability to modify response to factors that promote anxiety will improve Outcome: Progressing   Problem: Education: Goal: Utilization of techniques to improve thought processes will improve Outcome: Progressing Goal: Knowledge of the prescribed therapeutic regimen will improve Outcome: Progressing   Problem: Activity: Goal: Interest or engagement in leisure activities will improve Outcome: Progressing Goal: Imbalance in normal sleep/wake cycle will improve Outcome: Progressing   Problem: Coping: Goal: Coping ability will improve Outcome: Progressing Goal: Will verbalize feelings Outcome: Progressing   Problem: Health Behavior/Discharge Planning: Goal: Ability to make decisions will improve Outcome: Progressing Goal: Compliance with therapeutic regimen will improve Outcome: Progressing   Problem: Role Relationship: Goal: Will demonstrate positive changes in social behaviors and relationships Outcome: Progressing   Problem: Safety: Goal: Ability to disclose and discuss suicidal ideas will improve Outcome: Progressing Goal: Ability to identify and utilize support systems that promote safety will improve Outcome: Progressing   Problem: Self-Concept: Goal: Will verbalize positive feelings about self Outcome: Progressing Goal: Level of anxiety will decrease Outcome: Progressing   Problem: Education: Goal: Ability to make informed decisions regarding treatment will improve Outcome: Progressing   Problem: Coping: Goal: Coping ability will improve Outcome: Progressing   Problem: Health Behavior/Discharge Planning: Goal: Identification of resources available to assist in meeting health care needs will improve Outcome: Progressing   Problem: Medication: Goal: Compliance with prescribed medication regimen will  improve Outcome: Progressing   Problem: Self-Concept: Goal: Ability to disclose and discuss suicidal ideas will improve Outcome: Progressing Goal: Will verbalize positive feelings about self Outcome: Progressing
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
## 2021-03-01 NOTE — Progress Notes (Signed)
[facility] MD Progress Note  03/01/2021 1:28 PM Anna [name]  MRN:  [id number] Subjective: Follow-up patient with severe recurrent major depression.  Patient seen and chart reviewed.  Patient says she continues to feel sad and depressed and hopeless.  Passive suicidal thoughts.  No intent on the unit.  No active psychotic symptoms.  Tolerating medicines well.  Labs reviewed.  Slightly increased average blood glucose but otherwise everything pretty normal.  EKG normal.  Patient has no new complaints today.  Staff note that the patient remains irritable much of the time but remains agreeable to ECT Principal Problem: Severe recurrent major depression without psychotic features (HCC) Diagnosis: Principal Problem:   Severe recurrent major depression without psychotic features (HCC) Active Problems:   Cocaine use disorder, severe, dependence (HCC)   Degenerative lumbar disc   Nicotine dependence with current use   PTSD (post-traumatic stress disorder)  Total Time spent with patient: 30 minutes  Past Psychiatric History: Past history of recurrent severe depression PTSD and substance abuse  Past Medical History:  Past Medical History:  Diagnosis Date   Anxiety    Borderline personality disorder in adult [facility])    Hearing deficit 07/13/2020   MDD (major depressive disorder)    History reviewed. No pertinent surgical history. Family History: History reviewed. No pertinent family history. Family Psychiatric  History: See previous Social History:  Social History   Substance and Sexual Activity  Alcohol Use Not Currently     Social History   Substance and Sexual Activity  Drug Use Not Currently   Types: Cocaine, Marijuana    Social History   Socioeconomic History   Marital status: Single    Spouse name: Not on file   Number of children: Not on file   Years of education: Not on file   Highest education level: Not on file  Occupational History   Not on file  Tobacco Use   Smoking status:  Some Days    Packs/day: 0.20    Years: 40.00    Pack years: 8.00    Types: Cigarettes   Smokeless tobacco: Never  Vaping Use   Vaping Use: Every day   Start date: 04/07/2020   Substances: Nicotine, Flavoring  Substance and Sexual Activity   Alcohol use: Not Currently   Drug use: Not Currently    Types: Cocaine, Marijuana   Sexual activity: Not Currently    Birth control/protection: None  Other Topics Concern   Not on file  Social History Narrative   Not on file   Social Determinants of Health   Financial Resource Strain: Not on file  Food Insecurity: Not on file  Transportation Needs: Not on file  Physical Activity: Not on file  Stress: Not on file  Social Connections: Not on file   Additional Social History:                         Sleep: Fair  Appetite:  Fair  Current Medications: Current Facility-Administered Medications  Medication Dose Route Frequency Provider Last Rate Last Admin   acetaminophen (TYLENOL) tablet 650 mg  650 mg Oral Q6H PRN Isis Costanza T, MD       FLUoxetine (PROZAC) capsule 40 mg  40 mg Oral Daily Silas Muff T, MD   40 mg at 03/01/21 0815   hydrOXYzine (ATARAX/VISTARIL) tablet 25 mg  25 mg Oral TID PRN Cassandra Harbold, Jackquline Denmark, MD       ibuprofen (ADVIL) tablet 600 mg  600 mg  Oral Q6H PRN Nancye Grumbine T, MD   600 mg at 02/27/21 1244   melatonin tablet 5 mg  5 mg Oral QHS Damario Gillie T, MD   5 mg at 02/28/21 2122   nicotine (NICODERM CQ - dosed in mg/24 hours) patch 21 mg  21 mg Transdermal Daily Kriti Katayama T, MD       OLANZapine (ZYPREXA) tablet 12.5 mg  12.5 mg Oral QHS Donnis Pecha T, MD   12.5 mg at 02/28/21 2122   And   OLANZapine (ZYPREXA) tablet 2.5 mg  2.5 mg Oral Daily Luwanda Starr T, MD   2.5 mg at 03/01/21 0815   OLANZapine zydis (ZYPREXA) disintegrating tablet 5 mg  5 mg Oral Q8H PRN Cloria Ciresi T, MD   5 mg at 02/27/21 1452   And   ziprasidone (GEODON) injection 20 mg  20 mg Intramuscular PRN Cailie Bosshart T, MD        polyethylene glycol (MIRALAX / GLYCOLAX) packet 17 g  17 g Oral Daily PRN Jakhari Space T, MD       prazosin (MINIPRESS) capsule 1 mg  1 mg Oral QHS Mishawn Hemann T, MD   1 mg at 02/28/21 2122   senna (SENOKOT) tablet 8.6 mg  1 tablet Oral Daily PRN Pegeen Stiger, Jackquline Denmark, MD        Lab Results: No results found for this or any previous visit (from the past 48 hour(s)).  Blood Alcohol level:  Lab Results  Component Value Date   ETH <10 02/08/2021   ETH <10 07/13/2020    Metabolic Disorder Labs: Lab Results  Component Value Date   HGBA1C 5.5 02/27/2021   MPG 111.15 02/27/2021   MPG 116.89 02/08/2021   No results found for: PROLACTIN Lab Results  Component Value Date   CHOL 222 (H) 02/11/2021   TRIG 200 (H) 02/11/2021   HDL 41 02/11/2021   CHOLHDL 5.4 02/11/2021   VLDL 40 02/11/2021   LDLCALC 141 (H) 02/11/2021   LDLCALC 187 (H) 02/08/2021    Physical Findings: AIMS:  , ,  ,  ,    CIWA:    COWS:     Musculoskeletal: Strength & Muscle Tone: within normal limits Gait & Station: normal Patient leans: N/A  Psychiatric Specialty Exam:  Presentation  General Appearance: Disheveled (poorly groomed)  Eye Contact:Fleeting  Speech:Slow  Speech Volume:Decreased  Handedness:Right   Mood and Affect  Mood:Anxious; Depressed; Dysphoric; Hopeless; Worthless  Affect:Congruent; Constricted   Thought Process  Thought Processes:Linear  Descriptions of Associations:Intact  Orientation:Full (Time, Place and Person)  Thought Content:Paranoid Ideation  History of Schizophrenia/Schizoaffective disorder:No  Duration of Psychotic Symptoms:N/A  Hallucinations:No data recorded Ideas of Reference:Paranoia  Suicidal Thoughts:No data recorded Homicidal Thoughts:No data recorded  Sensorium  Memory:Immediate Fair; Recent Fair; Remote Fair  Judgment:Poor  Insight:Poor   Executive Functions  Concentration:Poor  Attention Span:Poor  Recall:Poor  Fund of  Knowledge:Fair  Language:Fair   Psychomotor Activity  Psychomotor Activity:No data recorded  Assets  Assets:Leisure Time   Sleep  Sleep:No data recorded   Physical Exam: Physical Exam Vitals and nursing note reviewed.  Constitutional:      Appearance: Normal appearance.  HENT:     Head: Normocephalic and atraumatic.     Mouth/Throat:     Pharynx: Oropharynx is clear.  Eyes:     Pupils: Pupils are equal, round, and reactive to light.  Cardiovascular:     Rate and Rhythm: Normal rate and regular rhythm.  Pulmonary:  Effort: Pulmonary effort is normal.     Breath sounds: Normal breath sounds.  Abdominal:     General: Abdomen is flat.     Palpations: Abdomen is soft.  Musculoskeletal:        General: Normal range of motion.  Skin:    General: Skin is warm and dry.  Neurological:     General: No focal deficit present.     Mental Status: She is alert. Mental status is at baseline.  Psychiatric:        Attention and Perception: She is inattentive.        Mood and Affect: Mood is depressed.        Speech: Speech is delayed.        Behavior: Behavior is slowed.        Thought Content: Thought content includes suicidal ideation. Thought content does not include suicidal plan.   Review of Systems  Constitutional: Negative.   HENT: Negative.    Eyes: Negative.   Respiratory: Negative.    Cardiovascular: Negative.   Gastrointestinal: Negative.   Musculoskeletal: Negative.   Skin: Negative.   Neurological: Negative.   Psychiatric/Behavioral:  Positive for depression and suicidal ideas. The patient is nervous/anxious.   Blood pressure 135/87, pulse 74, temperature 97.7 F (36.5 C), temperature source Oral, resp. rate 17, height 5\' 8"  (1.727 m), weight 103.9 kg, SpO2 97 %. Body mass index is 34.82 kg/m.   Treatment Plan Summary: Plan no change to medication management.  No change to overall plan.  ECT scheduled for Monday.  Supportive counseling therapy and review  of treatment plan completed with the patient.  Thursday, MD 03/01/2021, 1:28 PM

## 2021-03-01 NOTE — Progress Notes (Signed)
Complained of anxiety at beginning of shift and received hydroxyzine 25 mg, which was effective. Continues to report passive SI with no plan and is able to contract for safety while in the hospital. Denies AVH and HI. Medication compliant. Out of room briefly for snack but otherwise isolative

## 2021-03-02 MED ORDER — DOXEPIN HCL 25 MG PO CAPS
25.0000 mg | ORAL_CAPSULE | Freq: Every evening | ORAL | Status: DC | PRN
  Filled 2021-03-02: qty 1

## 2021-03-02 NOTE — Group Note (Signed)
LCSW Group Therapy Note  Group Date: 03/02/2021 Start Time: 1300 End Time: 1350   Type of Therapy and Topic:  Group Therapy - Healthy vs Unhealthy Coping Skills  Participation Level:  Did Not Attend   Description of Group The focus of this group was to determine what unhealthy coping techniques typically are used by group members and what healthy coping techniques would be helpful in coping with various problems. Patients were guided in becoming aware of the differences between healthy and unhealthy coping techniques. Patients were asked to identify 2-3 healthy coping skills they would like to learn to use more effectively.  Therapeutic Goals Patients learned that coping is what human beings do all day long to deal with various situations in their lives Patients defined and discussed healthy vs unhealthy coping techniques Patients identified their preferred coping techniques and identified whether these were healthy or unhealthy Patients determined 2-3 healthy coping skills they would like to become more familiar with and use more often. Patients provided support and ideas to each other   Summary of Patient Progress: Patient did not attend group despite encouraged participation.    Therapeutic Modalities Cognitive Behavioral Therapy Motivational Interviewing  Norberto Sorenson, Theresia Majors 03/02/2021  4:07 PM

## 2021-03-02 NOTE — Progress Notes (Signed)
Pt is A & O x3.  She is cooperative but has remained in room the vast majority of the day.  She did not attend group. She was compliant with medication except the nicotine patch. Stated she does not wear the patch.  She verbalized depression but no SI thoughts.  She ate her breakfast and lunch.  Safety check Q15 while on unit.  Denies SI/HI.

## 2021-03-02 NOTE — Progress Notes (Addendum)
[facility] MD Progress Note  03/02/2021 11:27 AM Anna [name]  MRN:  [id number] Subjective: Follow-up patient with severe recurrent major depression.    " Still feel the same way"  Patient seen in her room and chart reviewed.   Per nursing- Complained of anxiety at beginning of shift and received hydroxyzine 25 mg, which was effective. Continues to report passive SI with no plan and is able to contract for safety while in the hospital. Denies AVH and HI. Medication compliant. Out of room briefly for snack but otherwise isolative. Patient says she continues to feel sad and depressed and hopeless. Pt endorses   passive suicidal thoughts.  No intent on the unit, no plan.  No active psychotic symptoms.  Tolerating medicines well.  Patient has no new complaints today.  Agreeable for ECT planned from Monday.  Principal Problem: Severe recurrent major depression without psychotic features (HCC) Diagnosis: Principal Problem:   Severe recurrent major depression without psychotic features (HCC) Active Problems:   Cocaine use disorder, severe, dependence (HCC)   Degenerative lumbar disc   Nicotine dependence with current use   PTSD (post-traumatic stress disorder)  Total Time spent with patient: 30 minutes  Past Psychiatric History: Past history of recurrent severe depression PTSD and substance abuse  Past Medical History:  Past Medical History:  Diagnosis Date   Anxiety    Borderline personality disorder in adult [facility])    Hearing deficit 07/13/2020   MDD (major depressive disorder)    History reviewed. No pertinent surgical history. Family History: History reviewed. No pertinent family history. Family Psychiatric  History: See previous Social History:  Social History   Substance and Sexual Activity  Alcohol Use Not Currently     Social History   Substance and Sexual Activity  Drug Use Not Currently   Types: Cocaine, Marijuana    Social History   Socioeconomic History   Marital status:  Single    Spouse name: Not on file   Number of children: Not on file   Years of education: Not on file   Highest education level: Not on file  Occupational History   Not on file  Tobacco Use   Smoking status: Some Days    Packs/day: 0.20    Years: 40.00    Pack years: 8.00    Types: Cigarettes   Smokeless tobacco: Never  Vaping Use   Vaping Use: Every day   Start date: 04/07/2020   Substances: Nicotine, Flavoring  Substance and Sexual Activity   Alcohol use: Not Currently   Drug use: Not Currently    Types: Cocaine, Marijuana   Sexual activity: Not Currently    Birth control/protection: None  Other Topics Concern   Not on file  Social History Narrative   Not on file   Social Determinants of Health   Financial Resource Strain: Not on file  Food Insecurity: Not on file  Transportation Needs: Not on file  Physical Activity: Not on file  Stress: Not on file  Social Connections: Not on file   Additional Social History:                         Sleep: Fair  Appetite:  Fair  Current Medications: Current Facility-Administered Medications  Medication Dose Route Frequency Provider Last Rate Last Admin   acetaminophen (TYLENOL) tablet 650 mg  650 mg Oral Q6H PRN Clapacs, John T, MD       FLUoxetine (PROZAC) capsule 40 mg  40 mg Oral  Daily Clapacs, Jackquline Denmark, MD   40 mg at 03/02/21 3976   hydrOXYzine (ATARAX/VISTARIL) tablet 25 mg  25 mg Oral TID PRN Clapacs, Jackquline Denmark, MD   25 mg at 03/01/21 1951   ibuprofen (ADVIL) tablet 600 mg  600 mg Oral Q6H PRN Clapacs, John T, MD   600 mg at 02/27/21 1244   melatonin tablet 5 mg  5 mg Oral QHS Clapacs, John T, MD   5 mg at 03/01/21 2101   nicotine (NICODERM CQ - dosed in mg/24 hours) patch 21 mg  21 mg Transdermal Daily Clapacs, John T, MD       OLANZapine (ZYPREXA) tablet 12.5 mg  12.5 mg Oral QHS Clapacs, John T, MD   12.5 mg at 03/01/21 2100   And   OLANZapine (ZYPREXA) tablet 2.5 mg  2.5 mg Oral Daily Clapacs, John T, MD   2.5  mg at 03/02/21 0818   OLANZapine zydis (ZYPREXA) disintegrating tablet 5 mg  5 mg Oral Q8H PRN Clapacs, John T, MD   5 mg at 02/27/21 1452   And   ziprasidone (GEODON) injection 20 mg  20 mg Intramuscular PRN Clapacs, John T, MD       polyethylene glycol (MIRALAX / GLYCOLAX) packet 17 g  17 g Oral Daily PRN Clapacs, John T, MD       prazosin (MINIPRESS) capsule 1 mg  1 mg Oral QHS Clapacs, John T, MD   1 mg at 03/01/21 2101   senna (SENOKOT) tablet 8.6 mg  1 tablet Oral Daily PRN Clapacs, Jackquline Denmark, MD        Lab Results: No results found for this or any previous visit (from the past 48 hour(s)).  Blood Alcohol level:  Lab Results  Component Value Date   ETH <10 02/08/2021   ETH <10 07/13/2020    Metabolic Disorder Labs: Lab Results  Component Value Date   HGBA1C 5.5 02/27/2021   MPG 111.15 02/27/2021   MPG 116.89 02/08/2021   No results found for: PROLACTIN Lab Results  Component Value Date   CHOL 222 (H) 02/11/2021   TRIG 200 (H) 02/11/2021   HDL 41 02/11/2021   CHOLHDL 5.4 02/11/2021   VLDL 40 02/11/2021   LDLCALC 141 (H) 02/11/2021   LDLCALC 187 (H) 02/08/2021    Physical Findings: AIMS:  , ,  ,  ,    CIWA:    COWS:     Musculoskeletal: Strength & Muscle Tone: within normal limits Gait & Station: normal Patient leans: N/A  Psychiatric Specialty Exam:  Presentation  General Appearance: Disheveled (poorly groomed)  Eye Contact: limited Speech:Slow  Speech Volume:Decreased  Handedness:Right   Mood and Affect  Mood:Anxious; Depressed; Dysphoric; Hopeless; Worthless  Affect:Congruent; Constricted   Thought Process  Thought Processes:Linear concrete Descriptions of Associations:Intact  Orientation:Full (Time, Place and Person)  Thought Content:Paranoid Ideation  History of Schizophrenia/Schizoaffective disorder:No  Duration of Psychotic Symptoms:N/A  Hallucinations: denies Ideas of Reference:Paranoia  Suicidal Thoughts: yes SI, no  plan Homicidal Thoughts:denies  Sensorium  Memory:Immediate Fair; Recent Fair; Remote Fair  Judgment:Poor  Insight:Poor   Executive Functions  Concentration:Poor  Attention Span:Poor  Recall:Poor  Fund of Knowledge:Fair  Language:Fair   Psychomotor Activity  Psychomotor Activity:No data recorded  Assets  Assets:Leisure Time   Sleep  Sleep:No data recorded   Physical Exam: Physical Exam Vitals and nursing note reviewed.  Constitutional:      Appearance: Normal appearance.  HENT:     Head: Normocephalic and atraumatic.  Mouth/Throat:     Pharynx: Oropharynx is clear.  Eyes:     Pupils: Pupils are equal, round, and reactive to light.  Cardiovascular:     Rate and Rhythm: Normal rate and regular rhythm.  Pulmonary:     Effort: Pulmonary effort is normal.  Abdominal:     General: Abdomen is flat.     Palpations: Abdomen is soft.  Musculoskeletal:        General: Normal range of motion.  Skin:    General: Skin is warm and dry.  Neurological:     General: No focal deficit present.     Mental Status: She is alert. Mental status is at baseline.  Psychiatric:        Attention and Perception: She is inattentive.        Mood and Affect: Mood is depressed.        Speech: Speech is delayed.        Behavior: Behavior is slowed.        Thought Content: Thought content includes suicidal ideation. Thought content does not include suicidal plan.   Review of Systems  Constitutional: Negative.   HENT: Negative.    Eyes: Negative.   Respiratory: Negative.    Cardiovascular: Negative.   Gastrointestinal: Negative.   Musculoskeletal: Negative.   Skin: Negative.   Neurological: Negative.   Psychiatric/Behavioral:  Positive for depression and suicidal ideas. The patient is nervous/anxious.   Blood pressure 103/78, pulse 73, temperature 98.2 F (36.8 C), temperature source Oral, resp. rate 18, height 5\' 8"  (1.727 m), weight 103.9 kg, SpO2 98 %. Body mass index is  34.82 kg/m.   Treatment Plan Summary: Plan no change to medication management.  No change to overall plan.  ECT scheduled for Monday.  Supportive counseling therapy and review of treatment plan completed with the patient. Pt depressed and having SI, reports inturrupted sleep.  Prozac for depression/anxiety. Zyprexa for mood Vistaril prn for anxiety. Minipress for nightmares/PTSD Nicotine patch for nicotine dependence. Doxepin prn for sleep.  Group and milieu tx as tolerated.  Thursday, MD 03/02/2021, 11:27 AM Patient ID: 03/04/2021, female   DOB: 1970/11/08, 50 y.o.   MRN: [id number]

## 2021-03-03 NOTE — Progress Notes (Signed)
[facility] MD Progress Note  03/03/2021 12:56 PM Anna [name]  MRN:  [id number] Subjective: Follow-up patient with severe recurrent major depression.    " Not good"  Patient seen in her room and chart reviewed.   Per nursing- Patient presents with flat sad, affect. Minimal interaction with staff and peers. Denies SI, HI, AVH. Up in dayroom this evening, eating snacks. Took pm medications, did not request any prn meds.Encouragement and support provided. Safety checks maintained. Medications given as prescribed. Pt receptive and remains safe on unit with q15 min checks  Patient seen today in her room, she reports  she continues to feel sad and depressed and hopeless. Pt endorses  passive suicidal thoughts, denies intent, no plan.  No active psychotic symptoms.  Tolerating medicines well.  Patient has no new complaints today.  Agreeable for ECT planned from Monday.  Principal Problem: Severe recurrent major depression without psychotic features (HCC) Diagnosis: Principal Problem:   Severe recurrent major depression without psychotic features (HCC) Active Problems:   Cocaine use disorder, severe, dependence (HCC)   Degenerative lumbar disc   Nicotine dependence with current use   PTSD (post-traumatic stress disorder)  Total Time spent with patient: 30 minutes  Past Psychiatric History: Past history of recurrent severe depression PTSD and substance abuse  Past Medical History:  Past Medical History:  Diagnosis Date   Anxiety    Borderline personality disorder in adult [facility])    Hearing deficit 07/13/2020   MDD (major depressive disorder)    History reviewed. No pertinent surgical history. Family History: History reviewed. No pertinent family history. Family Psychiatric  History: See previous Social History:  Social History   Substance and Sexual Activity  Alcohol Use Not Currently     Social History   Substance and Sexual Activity  Drug Use Not Currently   Types: Cocaine, Marijuana     Social History   Socioeconomic History   Marital status: Single    Spouse name: Not on file   Number of children: Not on file   Years of education: Not on file   Highest education level: Not on file  Occupational History   Not on file  Tobacco Use   Smoking status: Some Days    Packs/day: 0.20    Years: 40.00    Pack years: 8.00    Types: Cigarettes   Smokeless tobacco: Never  Vaping Use   Vaping Use: Every day   Start date: 04/07/2020   Substances: Nicotine, Flavoring  Substance and Sexual Activity   Alcohol use: Not Currently   Drug use: Not Currently    Types: Cocaine, Marijuana   Sexual activity: Not Currently    Birth control/protection: None  Other Topics Concern   Not on file  Social History Narrative   Not on file   Social Determinants of Health   Financial Resource Strain: Not on file  Food Insecurity: Not on file  Transportation Needs: Not on file  Physical Activity: Not on file  Stress: Not on file  Social Connections: Not on file   Additional Social History:                         Sleep: Fair  Appetite:  Fair  Current Medications: Current Facility-Administered Medications  Medication Dose Route Frequency Provider Last Rate Last Admin   acetaminophen (TYLENOL) tablet 650 mg  650 mg Oral Q6H PRN Clapacs, Jackquline Denmark, MD       doxepin (SINEQUAN) capsule 25 mg  25 mg Oral QHS PRN Beverly Sessions, MD       FLUoxetine (PROZAC) capsule 40 mg  40 mg Oral Daily Clapacs, Jackquline Denmark, MD   40 mg at 03/03/21 0747   hydrOXYzine (ATARAX/VISTARIL) tablet 25 mg  25 mg Oral TID PRN Clapacs, Jackquline Denmark, MD   25 mg at 03/01/21 1951   ibuprofen (ADVIL) tablet 600 mg  600 mg Oral Q6H PRN Clapacs, John T, MD   600 mg at 02/27/21 1244   melatonin tablet 5 mg  5 mg Oral QHS Clapacs, John T, MD   5 mg at 03/02/21 2051   nicotine (NICODERM CQ - dosed in mg/24 hours) patch 21 mg  21 mg Transdermal Daily Clapacs, John T, MD       OLANZapine (ZYPREXA) tablet 12.5 mg  12.5 mg  Oral QHS Clapacs, John T, MD   12.5 mg at 03/02/21 2051   And   OLANZapine (ZYPREXA) tablet 2.5 mg  2.5 mg Oral Daily Clapacs, John T, MD   2.5 mg at 03/03/21 0746   OLANZapine zydis (ZYPREXA) disintegrating tablet 5 mg  5 mg Oral Q8H PRN Clapacs, John T, MD   5 mg at 02/27/21 1452   And   ziprasidone (GEODON) injection 20 mg  20 mg Intramuscular PRN Clapacs, John T, MD       polyethylene glycol (MIRALAX / GLYCOLAX) packet 17 g  17 g Oral Daily PRN Clapacs, John T, MD       prazosin (MINIPRESS) capsule 1 mg  1 mg Oral QHS Clapacs, John T, MD   1 mg at 03/02/21 2052   senna (SENOKOT) tablet 8.6 mg  1 tablet Oral Daily PRN Clapacs, Jackquline Denmark, MD        Lab Results: No results found for this or any previous visit (from the past 48 hour(s)).  Blood Alcohol level:  Lab Results  Component Value Date   ETH <10 02/08/2021   ETH <10 07/13/2020    Metabolic Disorder Labs: Lab Results  Component Value Date   HGBA1C 5.5 02/27/2021   MPG 111.15 02/27/2021   MPG 116.89 02/08/2021   No results found for: PROLACTIN Lab Results  Component Value Date   CHOL 222 (H) 02/11/2021   TRIG 200 (H) 02/11/2021   HDL 41 02/11/2021   CHOLHDL 5.4 02/11/2021   VLDL 40 02/11/2021   LDLCALC 141 (H) 02/11/2021   LDLCALC 187 (H) 02/08/2021    Physical Findings: AIMS:  , ,  ,  ,    CIWA:    COWS:     Musculoskeletal: Strength & Muscle Tone: within normal limits Gait & Station: normal Patient leans: N/A  Psychiatric Specialty Exam:  Presentation  General Appearance: Disheveled (poorly groomed)  Eye Contact: limited Speech:Slow  Speech Volume:Decreased  Handedness:Right   Mood and Affect  Mood:Anxious; Depressed; Dysphoric; Hopeless; Worthless  Affect:depressed, congruent   Medical illustrator Descriptions of Associations:Intact  Orientation:Full (Time, Place and Person)  Thought Content:Paranoid Ideation  History of Schizophrenia/Schizoaffective  disorder:No  Duration of Psychotic Symptoms:N/A  Hallucinations: denies Ideas of Reference:Paranoia  Suicidal Thoughts: yes SI, no plan Homicidal Thoughts:denies  Sensorium  Memory:Immediate Fair; Recent Fair; Remote Fair  Judgment:Poor  Insight:Poor   Executive Functions  Concentration:Poor  Attention Span:Poor  Recall:Poor  Fund of Knowledge:Fair  Language:Fair   Psychomotor Activity  Psychomotor Activity:No data recorded  Assets  Assets:Leisure Time   Sleep  Sleep:No data recorded   Physical Exam: Physical Exam Vitals and nursing  note reviewed.  Constitutional:      Appearance: Normal appearance.  HENT:     Head: Normocephalic and atraumatic.     Mouth/Throat:     Pharynx: Oropharynx is clear.  Eyes:     Pupils: Pupils are equal, round, and reactive to light.  Cardiovascular:     Rate and Rhythm: Normal rate and regular rhythm.  Pulmonary:     Effort: Pulmonary effort is normal.  Abdominal:     General: Abdomen is flat.     Palpations: Abdomen is soft.  Musculoskeletal:        General: Normal range of motion.  Skin:    General: Skin is warm and dry.  Neurological:     General: No focal deficit present.     Mental Status: She is alert. Mental status is at baseline.  Psychiatric:        Attention and Perception: She is inattentive.        Mood and Affect: Mood is depressed.        Speech: Speech is delayed.        Behavior: Behavior is slowed.        Thought Content: Thought content includes suicidal ideation. Thought content does not include suicidal plan.   Review of Systems  Constitutional: Negative.   HENT: Negative.    Eyes: Negative.   Respiratory: Negative.    Cardiovascular: Negative.   Gastrointestinal: Negative.   Musculoskeletal: Negative.   Skin: Negative.   Neurological: Negative.   Psychiatric/Behavioral:  Positive for depression and suicidal ideas. The patient is nervous/anxious.   Blood pressure 109/80, pulse 69,  temperature 97.7 F (36.5 C), temperature source Oral, resp. rate 18, height 5\' 8"  (1.727 m), weight 103.9 kg, SpO2 97 %. Body mass index is 34.82 kg/m.   Treatment Plan Summary: Plan no change to medication management.  No change to overall plan.  ECT scheduled for Monday.  Supportive counseling therapy and review of treatment plan completed with the patient. Pt depressed and reporting  SI.   Prozac for depression/anxiety. Zyprexa for mood Vistaril prn for anxiety. Minipress for nightmares/PTSD Nicotine patch for nicotine dependence. Doxepin prn for sleep.  Group and milieu tx as tolerated.   Saturday, MD 03/03/2021, 12:56 PM Patient ID: 03/05/2021, female   DOB: [birth year], 51 y.o.   MRN: [id number] Patient ID: Anna [name], female   DOB: 1970/12/04, 50 y.o.   MRN: [id number]

## 2021-03-03 NOTE — Group Note (Unsigned)
LCSW Group Therapy Note  Group Date: 03/03/2021 Start Time: 1300 End Time: 1400   Type of Therapy and Topic:  Group Therapy - How To Cope with Nervousness about Discharge   Participation Level:  {[facility] PARTICIPATION MGQQP:61950}   Description of Group This process group involved identification of patients' feelings about discharge. Some of them are scheduled to be discharged soon, while others are new admissions, but each of them was asked to share thoughts and feelings surrounding discharge from the hospital. One common theme was that they are excited at the prospect of going home, while another was that many of them are apprehensive about sharing why they were hospitalized. Patients were given the opportunity to discuss these feelings with their peers in preparation for discharge.  Therapeutic Goals  1. Patient will identify their overall feelings about pending discharge. 2. Patient will think about how they might proactively address issues that they believe will once again arise once they get home (i.e. with parents). 3. Patients will participate in discussion about having hope for change.   Summary of Patient Progress:  *** was very active throughout the session. *** demonstrated *** insight into the subject matter, and proved open to input from peers and feedback from CSW. *** was respectful of peers and participated throughout the entire session.   Therapeutic Modalities Cognitive Behavioral Therapy   Norberto Sorenson, Theresia Majors 03/03/2021  2:16 PM

## 2021-03-03 NOTE — Progress Notes (Signed)
Pt stated her goal was to get a shower today.  States she slept fair last night.  Rates her anxiety of 9.  She continues to endorse thoughts of harming herself.    Patient was given supplies to shower.

## 2021-03-03 NOTE — Progress Notes (Signed)
Patient was medication compliant.  She had very little interaction with the staff or peers on the unit.  She ate breakfast and lunch and then went to her room.  She is scheduled for ECT on Monday.  She would not accept the Nicotine patch, "stated she does not need it and has told staff numerous times".  Safety check on unit q15 to keep the patient safe.

## 2021-03-03 NOTE — Plan of Care (Signed)
  Problem: Education: Goal: Emotional status will improve Outcome: Not Progressing Goal: Mental status will improve Outcome: Not Progressing   Problem: Activity: Goal: Interest or engagement in activities will improve Outcome: Not Progressing Goal: Sleeping patterns will improve Outcome: Progressing

## 2021-03-03 NOTE — Group Note (Signed)
LCSW Group Therapy Note  Group Date: 03/03/2021 Start Time: 1300 End Time: 1400   Type of Therapy and Topic:  Group Therapy - How To Cope with Nervousness about Discharge   Participation Level:  Did Not Attend   Description of Group This process group involved identification of patients' feelings about discharge. Some of them are scheduled to be discharged soon, while others are new admissions, but each of them was asked to share thoughts and feelings surrounding discharge from the hospital. One common theme was that they are excited at the prospect of going home, while another was that many of them are apprehensive about sharing why they were hospitalized. Patients were given the opportunity to discuss these feelings with their peers in preparation for discharge.  Therapeutic Goals  Patient will identify their overall feelings about pending discharge. Patient will think about how they might proactively address issues that they believe will once again arise once they get home (i.e. with parents). Patients will participate in discussion about having hope for change.   Summary of Patient Progress: Patient did not attend group despite encouraged participation.   Therapeutic Modalities Cognitive Behavioral Therapy   Marletta Lor 03/03/2021  2:29 PM

## 2021-03-03 NOTE — Progress Notes (Signed)
Patient presents with flat sad, affect. Minimal interaction with staff and peers. Denies SI, HI, AVH. Up in dayroom this evening, eating snacks. Took pm medications, did not request any prn meds. Encouragement and support provided. Safety checks maintained. Medications given as prescribed. Pt receptive and remains safe on unit with q15 min checks.

## 2021-03-04 NOTE — Progress Notes (Signed)
[facility] MD Progress Note  03/04/2021 4:34 PM Anna [name]  MRN:  [id number] Subjective: Follow-up note for 50 year old woman with depression who was referred to our hospital for ECT.  Documented over the weekend that the patient remained depressed but not actively suicidal.  She was on the ECT schedule for today.  When patient discovered that this would require her to not eat anything in the morning she became explosively angry.  Cursing and making a loud scene.  Asking to be discharged immediately. Principal Problem: Severe recurrent major depression without psychotic features (HCC) Diagnosis: Principal Problem:   Severe recurrent major depression without psychotic features (HCC) Active Problems:   Cocaine use disorder, severe, dependence (HCC)   Degenerative lumbar disc   Nicotine dependence with current use   PTSD (post-traumatic stress disorder)  Total Time spent with patient: 30 minutes  Past Psychiatric History: Past history of longstanding recurrent depression  Past Medical History:  Past Medical History:  Diagnosis Date   Anxiety    Borderline personality disorder in adult [facility])    Hearing deficit 07/13/2020   MDD (major depressive disorder)    History reviewed. No pertinent surgical history. Family History: History reviewed. No pertinent family history. Family Psychiatric  History: See previous Social History:  Social History   Substance and Sexual Activity  Alcohol Use Not Currently     Social History   Substance and Sexual Activity  Drug Use Not Currently   Types: Cocaine, Marijuana    Social History   Socioeconomic History   Marital status: Single    Spouse name: Not on file   Number of children: Not on file   Years of education: Not on file   Highest education level: Not on file  Occupational History   Not on file  Tobacco Use   Smoking status: Some Days    Packs/day: 0.20    Years: 40.00    Pack years: 8.00    Types: Cigarettes   Smokeless tobacco:  Never  Vaping Use   Vaping Use: Every day   Start date: 04/07/2020   Substances: Nicotine, Flavoring  Substance and Sexual Activity   Alcohol use: Not Currently   Drug use: Not Currently    Types: Cocaine, Marijuana   Sexual activity: Not Currently    Birth control/protection: None  Other Topics Concern   Not on file  Social History Narrative   Not on file   Social Determinants of Health   Financial Resource Strain: Not on file  Food Insecurity: Not on file  Transportation Needs: Not on file  Physical Activity: Not on file  Stress: Not on file  Social Connections: Not on file   Additional Social History:                         Sleep: Poor  Appetite:  Fair  Current Medications: Current Facility-Administered Medications  Medication Dose Route Frequency Provider Last Rate Last Admin   acetaminophen (TYLENOL) tablet 650 mg  650 mg Oral Q6H PRN Young Mulvey, Jackquline Denmark, MD       doxepin (SINEQUAN) capsule 25 mg  25 mg Oral QHS PRN Beverly Sessions, MD       FLUoxetine (PROZAC) capsule 40 mg  40 mg Oral Daily Yovani Cogburn T, MD   40 mg at 03/04/21 1322   hydrOXYzine (ATARAX/VISTARIL) tablet 25 mg  25 mg Oral TID PRN Karlene Southard, Jackquline Denmark, MD   25 mg at 03/01/21 1951   ibuprofen (ADVIL) tablet  600 mg  600 mg Oral Q6H PRN Jordann Grime T, MD   600 mg at 02/27/21 1244   melatonin tablet 5 mg  5 mg Oral QHS Caran Storck T, MD   5 mg at 03/03/21 2100   nicotine (NICODERM CQ - dosed in mg/24 hours) patch 21 mg  21 mg Transdermal Daily Melanie Pellot T, MD       OLANZapine (ZYPREXA) tablet 12.5 mg  12.5 mg Oral QHS Mazelle Huebert T, MD   12.5 mg at 03/03/21 2101   And   OLANZapine (ZYPREXA) tablet 2.5 mg  2.5 mg Oral Daily Abdulkarim Eberlin T, MD   2.5 mg at 03/04/21 1322   OLANZapine zydis (ZYPREXA) disintegrating tablet 5 mg  5 mg Oral Q8H PRN Symia Herdt T, MD   5 mg at 02/27/21 1452   And   ziprasidone (GEODON) injection 20 mg  20 mg Intramuscular PRN Rishi Vicario T, MD        polyethylene glycol (MIRALAX / GLYCOLAX) packet 17 g  17 g Oral Daily PRN Delinda Malan T, MD       prazosin (MINIPRESS) capsule 1 mg  1 mg Oral QHS Yaneli Keithley T, MD   1 mg at 03/03/21 2100   senna (SENOKOT) tablet 8.6 mg  1 tablet Oral Daily PRN Corgan Mormile, Jackquline Denmark, MD        Lab Results: No results found for this or any previous visit (from the past 48 hour(s)).  Blood Alcohol level:  Lab Results  Component Value Date   ETH <10 02/08/2021   ETH <10 07/13/2020    Metabolic Disorder Labs: Lab Results  Component Value Date   HGBA1C 5.5 02/27/2021   MPG 111.15 02/27/2021   MPG 116.89 02/08/2021   No results found for: PROLACTIN Lab Results  Component Value Date   CHOL 222 (H) 02/11/2021   TRIG 200 (H) 02/11/2021   HDL 41 02/11/2021   CHOLHDL 5.4 02/11/2021   VLDL 40 02/11/2021   LDLCALC 141 (H) 02/11/2021   LDLCALC 187 (H) 02/08/2021    Physical Findings: AIMS:  , ,  ,  ,    CIWA:    COWS:     Musculoskeletal: Strength & Muscle Tone: within normal limits Gait & Station: normal Patient leans: N/A  Psychiatric Specialty Exam:  Presentation  General Appearance: Disheveled (poorly groomed)  Eye Contact:Fleeting  Speech:Slow  Speech Volume:Decreased  Handedness:Right   Mood and Affect  Mood:Anxious; Depressed; Dysphoric; Hopeless; Worthless  Affect:Congruent; Constricted   Thought Process  Thought Processes:Linear  Descriptions of Associations:Intact  Orientation:Full (Time, Place and Person)  Thought Content:Paranoid Ideation  History of Schizophrenia/Schizoaffective disorder:No  Duration of Psychotic Symptoms:N/A  Hallucinations:No data recorded Ideas of Reference:Paranoia  Suicidal Thoughts:No data recorded Homicidal Thoughts:No data recorded  Sensorium  Memory:Immediate Fair; Recent Fair; Remote Fair  Judgment:Poor  Insight:Poor   Executive Functions  Concentration:Poor  Attention Span:Poor  Recall:Poor  Fund of  Knowledge:Fair  Language:Fair   Psychomotor Activity  Psychomotor Activity:No data recorded  Assets  Assets:Leisure Time   Sleep  Sleep:No data recorded   Physical Exam: Physical Exam Vitals and nursing note reviewed.  Constitutional:      Appearance: Normal appearance.  HENT:     Head: Normocephalic and atraumatic.     Mouth/Throat:     Pharynx: Oropharynx is clear.  Eyes:     Pupils: Pupils are equal, round, and reactive to light.  Cardiovascular:     Rate and Rhythm: Normal rate and regular  rhythm.  Pulmonary:     Effort: Pulmonary effort is normal.     Breath sounds: Normal breath sounds.  Abdominal:     General: Abdomen is flat.     Palpations: Abdomen is soft.  Musculoskeletal:        General: Normal range of motion.  Skin:    General: Skin is warm and dry.  Neurological:     General: No focal deficit present.     Mental Status: She is alert. Mental status is at baseline.  Psychiatric:        Attention and Perception: She is inattentive.        Mood and Affect: Mood normal. Affect is angry and inappropriate.        Speech: Speech is tangential.        Behavior: Behavior is agitated.        Thought Content: Thought content normal.        Cognition and Memory: Cognition is impaired. Memory is impaired.        Judgment: Judgment is inappropriate.   Review of Systems  Constitutional: Negative.   HENT: Negative.    Eyes: Negative.   Respiratory: Negative.    Cardiovascular: Negative.   Gastrointestinal: Negative.   Musculoskeletal: Negative.   Skin: Negative.   Neurological: Negative.   Psychiatric/Behavioral:  Positive for depression. Negative for hallucinations, memory loss, substance abuse and suicidal ideas. The patient is not nervous/anxious and does not have insomnia.   Blood pressure 130/87, pulse 70, temperature 98 F (36.7 C), temperature source Oral, resp. rate 17, height 5\' 8"  (1.727 m), weight 103.9 kg, SpO2 97 %. Body mass index is 34.82  kg/m.   Treatment Plan Summary: Medication management and Plan no change to medication.  Reassess tomorrow when she may be in a better frame of mind to communicate.  If patient is going to refuse ECT and is not actively suicidal we are likely to consider discharge given the chronicity of her illness.  Currently she has calm down and has been cooperative the rest of the day.  , MD 03/04/2021, 4:34 PM

## 2021-03-04 NOTE — BH IP Treatment Plan (Signed)
Interdisciplinary Treatment and Diagnostic Plan Update  03/04/2021 Time of Session: 8:30AM Anna [name] MRN: [id number]  Principal Diagnosis: Severe recurrent major depression without psychotic features [facility])  Secondary Diagnoses: Principal Problem:   Severe recurrent major depression without psychotic features (HCC) Active Problems:   Cocaine use disorder, severe, dependence (HCC)   Degenerative lumbar disc   Nicotine dependence with current use   PTSD (post-traumatic stress disorder)   Current Medications:  Current Facility-Administered Medications  Medication Dose Route Frequency Provider Last Rate Last Admin   acetaminophen (TYLENOL) tablet 650 mg  650 mg Oral Q6H PRN Clapacs, Jackquline Denmark, MD       doxepin (SINEQUAN) capsule 25 mg  25 mg Oral QHS PRN Beverly Sessions, MD       FLUoxetine (PROZAC) capsule 40 mg  40 mg Oral Daily Clapacs, Jackquline Denmark, MD   40 mg at 03/03/21 0747   hydrOXYzine (ATARAX/VISTARIL) tablet 25 mg  25 mg Oral TID PRN Clapacs, Jackquline Denmark, MD   25 mg at 03/01/21 1951   ibuprofen (ADVIL) tablet 600 mg  600 mg Oral Q6H PRN Clapacs, John T, MD   600 mg at 02/27/21 1244   melatonin tablet 5 mg  5 mg Oral QHS Clapacs, John T, MD   5 mg at 03/03/21 2100   nicotine (NICODERM CQ - dosed in mg/24 hours) patch 21 mg  21 mg Transdermal Daily Clapacs, Jackquline Denmark, MD       OLANZapine (ZYPREXA) tablet 12.5 mg  12.5 mg Oral QHS Clapacs, John T, MD   12.5 mg at 03/03/21 2101   And   OLANZapine (ZYPREXA) tablet 2.5 mg  2.5 mg Oral Daily Clapacs, John T, MD   2.5 mg at 03/03/21 0746   OLANZapine zydis (ZYPREXA) disintegrating tablet 5 mg  5 mg Oral Q8H PRN Clapacs, Jackquline Denmark, MD   5 mg at 02/27/21 1452   And   ziprasidone (GEODON) injection 20 mg  20 mg Intramuscular PRN Clapacs, Jackquline Denmark, MD       polyethylene glycol (MIRALAX / GLYCOLAX) packet 17 g  17 g Oral Daily PRN Clapacs, Jackquline Denmark, MD       prazosin (MINIPRESS) capsule 1 mg  1 mg Oral QHS Clapacs, John T, MD   1 mg at 03/03/21 2100   senna  (SENOKOT) tablet 8.6 mg  1 tablet Oral Daily PRN Clapacs, Jackquline Denmark, MD       PTA Medications: Medications Prior to Admission  Medication Sig Dispense Refill Last Dose   FLUoxetine (PROZAC) 40 MG capsule Take 1 capsule (40 mg total) by mouth daily.  3    hydrOXYzine (ATARAX/VISTARIL) 25 MG tablet Take 1 tablet (25 mg total) by mouth 3 (three) times daily as needed for anxiety. 30 tablet 0    prazosin (MINIPRESS) 1 MG capsule Take 1 capsule (1 mg total) by mouth at bedtime.       Patient Stressors: Financial difficulties   Health problems   Marital or family conflict    Patient Strengths: Ability for insight  Average or above average intelligence  Communication skills  General fund of knowledge   Treatment Modalities: Medication Management, Group therapy, Case management,  1 to 1 session with clinician, Psychoeducation, Recreational therapy.   Physician Treatment Plan for Primary Diagnosis: Severe recurrent major depression without psychotic features (HCC) Long Term Goal(s): Improvement in symptoms so as ready for discharge   Short Term Goals: Ability to identify and develop effective coping behaviors will improve Ability to maintain  clinical measurements within normal limits will improve Compliance with prescribed medications will improve  Medication Management: Evaluate patient's response, side effects, and tolerance of medication regimen.  Therapeutic Interventions: 1 to 1 sessions, Unit Group sessions and Medication administration.  Evaluation of Outcomes: Not Progressing  Physician Treatment Plan for Secondary Diagnosis: Principal Problem:   Severe recurrent major depression without psychotic features (HCC) Active Problems:   Cocaine use disorder, severe, dependence (HCC)   Degenerative lumbar disc   Nicotine dependence with current use   PTSD (post-traumatic stress disorder)  Long Term Goal(s): Improvement in symptoms so as ready for discharge   Short Term Goals:  Ability to identify and develop effective coping behaviors will improve Ability to maintain clinical measurements within normal limits will improve Compliance with prescribed medications will improve     Medication Management: Evaluate patient's response, side effects, and tolerance of medication regimen.  Therapeutic Interventions: 1 to 1 sessions, Unit Group sessions and Medication administration.  Evaluation of Outcomes: Not Progressing   RN Treatment Plan for Primary Diagnosis: Severe recurrent major depression without psychotic features (HCC) Long Term Goal(s): Knowledge of disease and therapeutic regimen to maintain health will improve  Short Term Goals: Ability to verbalize frustration and anger appropriately will improve, Ability to demonstrate self-control, Ability to participate in decision making will improve, Ability to verbalize feelings will improve, Ability to disclose and discuss suicidal ideas, Ability to identify and develop effective coping behaviors will improve, and Compliance with prescribed medications will improve  Medication Management: RN will administer medications as ordered by provider, will assess and evaluate patient's response and provide education to patient for prescribed medication. RN will report any adverse and/or side effects to prescribing provider.  Therapeutic Interventions: 1 on 1 counseling sessions, Psychoeducation, Medication administration, Evaluate responses to treatment, Monitor vital signs and CBGs as ordered, Perform/monitor CIWA, COWS, AIMS and Fall Risk screenings as ordered, Perform wound care treatments as ordered.  Evaluation of Outcomes: Not Progressing   LCSW Treatment Plan for Primary Diagnosis: Severe recurrent major depression without psychotic features (HCC) Long Term Goal(s): Safe transition to appropriate next level of care at discharge, Engage patient in therapeutic group addressing interpersonal concerns.  Short Term Goals:  Engage patient in aftercare planning with referrals and resources, Increase social support, Increase ability to appropriately verbalize feelings, Increase emotional regulation, Facilitate acceptance of mental health diagnosis and concerns, and Increase skills for wellness and recovery  Therapeutic Interventions: Assess for all discharge needs, 1 to 1 time with Social worker, Explore available resources and support systems, Assess for adequacy in community support network, Educate family and significant other(s) on suicide prevention, Complete Psychosocial Assessment, Interpersonal group therapy.  Evaluation of Outcomes: Not Progressing   Progress in Treatment: Attending groups: No. Participating in groups: No. Taking medication as prescribed: Yes. Toleration medication: Yes. Family/Significant other contact made: No, will contact:  pt declined collateral contact. Patient understands diagnosis: Yes. Discussing patient identified problems/goals with staff: Yes. Medical problems stabilized or resolved: Yes. Denies suicidal/homicidal ideation: Yes. Issues/concerns per patient self-inventory: No. Other: none  New problem(s) identified: No, Describe:  none   New Short Term/Long Term Goal(s): elimination of symptoms of psychosis, medication management for mood stabilization; elimination of SI thoughts; development of comprehensive mental wellness/sobriety plan. Update 03/04/2021:  No changes at this time.    Patient Goals:  "I came here for ECT" Update 03/04/2021:  No changes at this time.    Discharge Plan or Barriers: CSW will assist the patient in developing discharge plans.  Update 03/04/2021:  Patient was to begin ECT this morning, however, declined treatment.  CSW will continue to work with patient on developing an appropriate discharge plan.    Reason for Continuation of Hospitalization: Anxiety Depression Medication stabilization Suicidal ideation   Estimated Length of Stay:  1-7  days Update 03/04/2021:  TBD   Scribe for Treatment Team: [name], LCSW 03/04/2021 12:32 PM

## 2021-03-04 NOTE — Plan of Care (Signed)
  Problem: Education: Goal: Emotional status will improve Outcome: Not Progressing Goal: Mental status will improve Outcome: Not Progressing Goal: Verbalization of understanding the information provided will improve Outcome: Progressing   Problem: Activity: Goal: Interest or engagement in activities will improve Outcome: Not Progressing Goal: Sleeping patterns will improve Outcome: Not Progressing   Problem: Coping: Goal: Ability to verbalize frustrations and anger appropriately will improve Outcome: Progressing Goal: Ability to demonstrate self-control will improve Outcome: Not Progressing   Problem: Coping: Goal: Ability to verbalize frustrations and anger appropriately will improve Outcome: Progressing Goal: Ability to demonstrate self-control will improve Outcome: Not Progressing   Problem: Health Behavior/Discharge Planning: Goal: Identification of resources available to assist in meeting health care needs will improve Outcome: Progressing Goal: Compliance with treatment plan for underlying cause of condition will improve Outcome: Progressing   Problem: Physical Regulation: Goal: Ability to maintain clinical measurements within normal limits will improve Outcome: Progressing   Problem: Safety: Goal: Periods of time without injury will increase Outcome: Progressing   Problem: Education: Goal: Ability to state activities that reduce stress will improve Outcome: Not Progressing   Problem: Coping: Goal: Ability to identify and develop effective coping behavior will improve Outcome: Not Progressing   Problem: Education: Goal: Utilization of techniques to improve thought processes will improve Outcome: Not Progressing Goal: Knowledge of the prescribed therapeutic regimen will improve Outcome: Not Progressing   Problem: Activity: Goal: Interest or engagement in leisure activities will improve Outcome: Not Progressing Goal: Imbalance in normal sleep/wake cycle  will improve Outcome: Not Progressing   Problem: Coping: Goal: Coping ability will improve Outcome: Not Progressing   Problem: Activity: Goal: Interest or engagement in leisure activities will improve Outcome: Not Progressing Goal: Imbalance in normal sleep/wake cycle will improve Outcome: Not Progressing

## 2021-03-04 NOTE — Plan of Care (Signed)
  Problem: Education: Goal: Knowledge of [facility] Education information/materials will improve Outcome: Progressing Goal: Emotional status will improve Outcome: Progressing Goal: Mental status will improve Outcome: Progressing Goal: Verbalization of understanding the information provided will improve Outcome: Progressing   Problem: Activity: Goal: Interest or engagement in activities will improve Outcome: Progressing Goal: Sleeping patterns will improve Outcome: Progressing   Problem: Coping: Goal: Ability to verbalize frustrations and anger appropriately will improve Outcome: Progressing Goal: Ability to demonstrate self-control will improve Outcome: Progressing   Problem: Health Behavior/Discharge Planning: Goal: Identification of resources available to assist in meeting health care needs will improve Outcome: Progressing Goal: Compliance with treatment plan for underlying cause of condition will improve Outcome: Progressing   Problem: Physical Regulation: Goal: Ability to maintain clinical measurements within normal limits will improve Outcome: Progressing   Problem: Safety: Goal: Periods of time without injury will increase Outcome: Progressing   Problem: Education: Goal: Ability to state activities that reduce stress will improve Outcome: Progressing   Problem: Coping: Goal: Ability to identify and develop effective coping behavior will improve Outcome: Progressing   Problem: Self-Concept: Goal: Ability to identify factors that promote anxiety will improve Outcome: Progressing Goal: Level of anxiety will decrease Outcome: Progressing Goal: Ability to modify response to factors that promote anxiety will improve Outcome: Progressing   Problem: Education: Goal: Utilization of techniques to improve thought processes will improve Outcome: Progressing Goal: Knowledge of the prescribed therapeutic regimen will improve Outcome: Progressing   Problem:  Activity: Goal: Interest or engagement in leisure activities will improve Outcome: Progressing Goal: Imbalance in normal sleep/wake cycle will improve Outcome: Progressing   Problem: Coping: Goal: Coping ability will improve Outcome: Progressing Goal: Will verbalize feelings Outcome: Progressing   Problem: Health Behavior/Discharge Planning: Goal: Ability to make decisions will improve Outcome: Progressing Goal: Compliance with therapeutic regimen will improve Outcome: Progressing   Problem: Role Relationship: Goal: Will demonstrate positive changes in social behaviors and relationships Outcome: Progressing   Problem: Safety: Goal: Ability to disclose and discuss suicidal ideas will improve Outcome: Progressing Goal: Ability to identify and utilize support systems that promote safety will improve Outcome: Progressing   Problem: Self-Concept: Goal: Will verbalize positive feelings about self Outcome: Progressing Goal: Level of anxiety will decrease Outcome: Progressing   Problem: Education: Goal: Ability to make informed decisions regarding treatment will improve Outcome: Progressing   Problem: Coping: Goal: Coping ability will improve Outcome: Progressing   Problem: Health Behavior/Discharge Planning: Goal: Identification of resources available to assist in meeting health care needs will improve Outcome: Progressing   Problem: Medication: Goal: Compliance with prescribed medication regimen will improve Outcome: Progressing   Problem: Self-Concept: Goal: Ability to disclose and discuss suicidal ideas will improve Outcome: Progressing Goal: Will verbalize positive feelings about self Outcome: Progressing    Patient has been isolative to room and self. Continues to report passive SI without plan. Contracts for safety.

## 2021-03-04 NOTE — Progress Notes (Signed)
RN approached patient to perform assessment. When RN asked patient how she is doing. Patient began yelling stating, "Not good. Im hungry and I'm ready to eat. What time is ECT." RN explained ECT to start at noon. Patient continued to yell and say.. "this make no fuckiing sense. I'm not doing ECT. I want to eat." MD made aware of patient's request for food and desire not to do ECT. Verbal order given to RN from MD to place diet order.

## 2021-03-04 NOTE — Group Note (Signed)
[facility] LCSW Group Therapy Note    Group Date: 03/04/2021 Start Time: 1300 End Time: 1400  Type of Therapy and Topic:  Group Therapy:  Overcoming Obstacles  Participation Level:  [facility] PARTICIPATION LEVEL: Did Not Attend  Mood:  Description of Group:   In this group patients will be encouraged to explore what they see as obstacles to their own wellness and recovery. They will be guided to discuss their thoughts, feelings, and behaviors related to these obstacles. The group will process together ways to cope with barriers, with attention given to specific choices patients can make. Each patient will be challenged to identify changes they are motivated to make in order to overcome their obstacles. This group will be process-oriented, with patients participating in exploration of their own experiences as well as giving and receiving support and challenge from other group members.  Therapeutic Goals: 1. Patient will identify personal and current obstacles as they relate to admission. 2. Patient will identify barriers that currently interfere with their wellness or overcoming obstacles.  3. Patient will identify feelings, thought process and behaviors related to these barriers. 4. Patient will identify two changes they are willing to make to overcome these obstacles:    Summary of Patient Progress   X   Therapeutic Modalities:   Cognitive Behavioral Therapy Solution Focused Therapy Motivational Interviewing Relapse Prevention Therapy   [name], LCSW

## 2021-03-04 NOTE — Progress Notes (Signed)
Patient to medication room to take morning medications due to being NPO this am.  Patient denies HI/AVH and pain. Rated depression at 8/10 and anxiety 2/10. Patient compliant with medication.  Isolates most of the day and only comes out for meals. Extremely irritable earlier but is redirectable now. Informs RN she is not going to do ECT.   Will cont to monitor for safety.

## 2021-03-04 NOTE — Progress Notes (Signed)
Patient remains NPO for ECT.

## 2021-03-05 MED ORDER — HYDROXYZINE HCL 25 MG PO TABS: 25.0000 mg | ORAL_TABLET | Freq: Three times a day (TID) | ORAL | 1 refills | Status: DC | PRN

## 2021-03-05 MED ORDER — OLANZAPINE 2.5 MG PO TABS: 12.5000 mg | ORAL_TABLET | Freq: Two times a day (BID) | ORAL | 0 refills | Status: DC

## 2021-03-05 MED ORDER — FLUOXETINE HCL 40 MG PO CAPS: 40.0000 mg | ORAL_CAPSULE | Freq: Every day | ORAL | 1 refills | Status: AC

## 2021-03-05 MED ORDER — MELATONIN 5 MG PO TABS: 5.0000 mg | ORAL_TABLET | Freq: Every day | ORAL | 1 refills | Status: DC

## 2021-03-05 MED ORDER — PRAZOSIN HCL 1 MG PO CAPS: 1.0000 mg | ORAL_CAPSULE | Freq: Every day | ORAL | 1 refills | Status: DC

## 2021-03-05 MED ORDER — NICOTINE 21 MG/24HR TD PT24: 21.0000 mg | MEDICATED_PATCH | Freq: Every day | TRANSDERMAL | 0 refills | Status: AC

## 2021-03-05 MED ORDER — OLANZAPINE 2.5 MG PO TABS: 2.5000 mg | ORAL_TABLET | Freq: Every day | ORAL | 1 refills | Status: DC

## 2021-03-05 MED ORDER — DOXEPIN HCL 25 MG PO CAPS: 25.0000 mg | ORAL_CAPSULE | Freq: Every evening | ORAL | 0 refills | Status: DC | PRN

## 2021-03-05 NOTE — BHH Counselor (Signed)
CSW provided the patient with list of boarding houses, shelter resources, community resources including access to food and clothing.   Penni Homans, MSW, LCSW 03/05/2021 3:42 PM

## 2021-03-05 NOTE — Discharge Summary (Signed)
Physician Discharge Summary Note  Patient:  Anna [name] is an 50 y.o., female MRN:  [id number] DOB:  [birth year] Patient phone:  [phone] (home)  Patient address:   [address] [place] [address],  Total Time spent with patient: 45 minutes  Date of Admission:  02/26/2021 Date of Discharge: 03/05/2021  Reason for Admission: Patient was admitted to the psychiatric service in transfer from behavioral health Hospital in [place].  She was being treated there for recurrent severe depression with suicidal ideation.  Patient had shown minimal lasting response to multiple trials of appropriate medication and was recommended for electroconvulsive therapy.  Principal Problem: Severe recurrent major depression without psychotic features [facility]) Discharge Diagnoses: Principal Problem:   Severe recurrent major depression without psychotic features ([place]) Active Problems:   Cocaine use disorder, severe, dependence ([place])   Degenerative lumbar disc   Nicotine dependence with current use   PTSD (post-traumatic stress disorder)   Past Psychiatric History: Multiple prior hospitalizations.  Positive for prior suicide attempts.  History of substance abuse  Past Medical History:  Past Medical History:  Diagnosis Date   Anxiety    Borderline personality disorder in adult [facility])    Hearing deficit 07/13/2020   MDD (major depressive disorder)    History reviewed. No pertinent surgical history. Family History: History reviewed. No pertinent family history. Family Psychiatric  History: None reported Social History:  Social History   Substance and Sexual Activity  Alcohol Use Not Currently     Social History   Substance and Sexual Activity  Drug Use Not Currently   Types: Cocaine, Marijuana    Social History   Socioeconomic History   Marital status: Single    Spouse name: Not on file   Number of children: Not on file   Years of education: Not on file   Highest education level:  Not on file  Occupational History   Not on file  Tobacco Use   Smoking status: Some Days    Packs/day: 0.20    Years: 40.00    Pack years: 8.00    Types: Cigarettes   Smokeless tobacco: Never  Vaping Use   Vaping Use: Every day   Start date: 04/07/2020   Substances: Nicotine, Flavoring  Substance and Sexual Activity   Alcohol use: Not Currently   Drug use: Not Currently    Types: Cocaine, Marijuana   Sexual activity: Not Currently    Birth control/protection: None  Other Topics Concern   Not on file  Social History Narrative   Not on file   Social Determinants of Health   Financial Resource Strain: Not on file  Food Insecurity: Not on file  Transportation Needs: Not on file  Physical Activity: Not on file  Stress: Not on file  Social Connections: Not on file    Hospital Course: Patient admitted to our psychiatric service.  15-minute checks maintained.  Patient was continued on medications as ordered in [place].  Patient met with the ECT team and reviewed indications potential benefits potential side effects and treatment plan.  She gave verbal consent and agreed to start ECT.  Meanwhile continue to be involved when possible in groups on the unit.  Patient did not display any violent or dangerous or self-injury behavior on the ward.  On the morning of the first scheduled ECT treatment patient became extremely angry and irate when she learned that she would be required to refrain from eating or drinking anything before the treatment.  She became so agitated she  really could not be reasoned with and insisted that she did not want ECT and only wanted breakfast.  Patient has been seen subsequently and continues to now decline the idea of ECT.  Patient has a dysphoric affect chronic depression chronic anxiety and chronic suicidal ideation without intent or plan but appears to be at her baseline now and not likely to benefit from further treatment on our inpatient unit.  She has spoken  with her acquaintances at her previous [facility] and is in the process of making plans to return there.  She will be referred for follow-up treatment in [facility].  Physical Findings: AIMS:  , ,  ,  ,    CIWA:    COWS:     Musculoskeletal: Strength & Muscle Tone: within normal limits Gait & Station: normal Patient leans: N/A   Psychiatric Specialty Exam:  Presentation  General Appearance: Disheveled (poorly groomed)  Eye Contact:Fleeting  Speech:Slow  Speech Volume:Decreased  Handedness:Right   Mood and Affect  Mood:Anxious; Depressed; Dysphoric; Hopeless; Worthless  Affect:Congruent; Constricted   Thought Process  Thought Processes:Linear  Descriptions of Associations:Intact  Orientation:Full (Time, Place and Person)  Thought Content:Paranoid Ideation  History of Schizophrenia/Schizoaffective disorder:No  Duration of Psychotic Symptoms:N/A  Hallucinations:No data recorded Ideas of Reference:Paranoia  Suicidal Thoughts:No data recorded Homicidal Thoughts:No data recorded  Sensorium  Memory:Immediate Fair; Recent Fair; Remote Fair  Judgment:Poor  Insight:Poor   Executive Functions  Concentration:Poor  Attention Span:Poor  Recall:Poor  Fund of Knowledge:Fair  Language:Fair   Psychomotor Activity  Psychomotor Activity:No data recorded  Assets  Assets:Leisure Time   Sleep  Sleep:No data recorded   Physical Exam: Physical Exam Vitals and nursing note reviewed.  Constitutional:      Appearance: Normal appearance.  HENT:     Head: Normocephalic and atraumatic.     Mouth/Throat:     Pharynx: Oropharynx is clear.  Eyes:     Pupils: Pupils are equal, round, and reactive to light.  Cardiovascular:     Rate and Rhythm: Normal rate and regular rhythm.  Pulmonary:     Effort: Pulmonary effort is normal.     Breath sounds: Normal breath sounds.  Abdominal:     General: Abdomen is flat.     Palpations: Abdomen is soft.   Musculoskeletal:        General: Normal range of motion.  Skin:    General: Skin is warm and dry.  Neurological:     General: No focal deficit present.     Mental Status: She is alert. Mental status is at baseline.  Psychiatric:        Attention and Perception: She is inattentive.        Mood and Affect: Mood normal. Affect is blunt.        Speech: Speech normal.        Behavior: Behavior is withdrawn.        Thought Content: Thought content includes suicidal ideation. Thought content does not include suicidal plan.        Cognition and Memory: Cognition normal.        Judgment: Judgment is impulsive.   Review of Systems  Constitutional: Negative.   HENT: Negative.    Eyes: Negative.   Respiratory: Negative.    Cardiovascular: Negative.   Gastrointestinal: Negative.   Musculoskeletal: Negative.   Skin: Negative.   Neurological: Negative.   Psychiatric/Behavioral:  Positive for depression and suicidal ideas. Negative for hallucinations and substance abuse. The patient is nervous/anxious and has  insomnia.   Blood pressure 122/76, pulse 74, temperature 97.7 F (36.5 C), temperature source Oral, resp. rate 18, height _0  (1.727 m), weight 103.9 kg, SpO2 98 %. Body mass index is 34.82 kg/m.   Social History   Tobacco Use  Smoking Status Some Days   Packs/day: 0.20   Years: 40.00   Pack years: 8.00   Types: Cigarettes  Smokeless Tobacco Never   Tobacco Cessation:  A prescription for an FDA-approved tobacco cessation medication provided at discharge   Blood Alcohol level:  Lab Results  Component Value Date   [facility] <10 02/08/2021   ETH <10 38/46/6599    Metabolic Disorder Labs:  Lab Results  Component Value Date   HGBA1C 5.5 02/27/2021   MPG 111.15 02/27/2021   MPG 116.89 02/08/2021   No results found for: PROLACTIN Lab Results  Component Value Date   CHOL 222 (H) 02/11/2021   TRIG 200 (H) 02/11/2021   HDL 41 02/11/2021   CHOLHDL 5.4 02/11/2021   VLDL 40  02/11/2021   LDLCALC 141 (H) 02/11/2021   LDLCALC 187 (H) 02/08/2021    See Psychiatric Specialty Exam and Suicide Risk Assessment completed by Attending Physician prior to discharge.  Discharge destination:  Home  Is patient on multiple antipsychotic therapies at discharge:  No   Has Patient had three or more failed trials of antipsychotic monotherapy by history:  No  Recommended Plan for Multiple Antipsychotic Therapies: NA  Discharge Instructions     Diet - low sodium heart healthy   Complete by: As directed    Increase activity slowly   Complete by: As directed       Allergies as of 03/05/2021       Reactions   Trazodone Other (See Comments)   Low blood pressure        Medication List     TAKE these medications      Indication  doxepin 25 MG capsule Commonly known as: SINEQUAN Take 1 capsule (25 mg total) by mouth at bedtime as needed (sleep).  Indication: Sleep   FLUoxetine 40 MG capsule Commonly known as: PROZAC Take 1 capsule (40 mg total) by mouth daily.  Indication: Depression, Major Depressive Disorder   hydrOXYzine 25 MG tablet Commonly known as: ATARAX Take 1 tablet (25 mg total) by mouth 3 (three) times daily as needed for anxiety.  Indication: Feeling Anxious   melatonin 5 MG Tabs Take 1 tablet (5 mg total) by mouth at bedtime.  Indication: Trouble Sleeping   nicotine 21 mg/24hr patch Commonly known as: NICODERM CQ - dosed in mg/24 hours Place 1 patch (21 mg total) onto the skin daily. Start taking on: March 06, 2021  Indication: Nicotine Addiction   OLANZapine 2.5 MG tablet Commonly known as: ZYPREXA Take 5 tablets (12.5 mg total) by mouth 2 (two) times daily.  Indication: Major Depressive Disorder   OLANZapine 2.5 MG tablet Commonly known as: ZYPREXA Take 1 tablet (2.5 mg total) by mouth daily. Start taking on: March 06, 2021  Indication: Major Depressive Disorder   prazosin 1 MG capsule Commonly known as:  MINIPRESS Take 1 capsule (1 mg total) by mouth at bedtime.  Indication: Frightening Dreams         Follow-up recommendations: Strongly encouraged continuing to avoid relapsing into use of drugs of any sort.  Strongly encouraged continued medication but even more importantly follow-up with treatment team for outpatient treatment and [place]  Comments: Prescriptions provided.  Patient agrees to plan  Signed:  Alethia Berthold, MD 03/05/2021, 11:21 AM

## 2021-03-05 NOTE — Progress Notes (Signed)
Patient resting in bed this morning. Prompted by staff for breakfast yet refused. States, "Im not hungry.". Denies HI/AH/VH and pain. Endorses SI without a plan. Rates depression and anxiety 10/10. Medication compliant. Per MD patient may be a candidate for discharge. No adverse reactions noted. Cont Q15 minute check for safety.

## 2021-03-05 NOTE — BHH Suicide Risk Assessment (Signed)
BHH INPATIENT:  Family/Significant Other Suicide Prevention Education  Suicide Prevention Education:  Patient Refusal for Family/Significant Other Suicide Prevention Education: The patient Anna [name] has refused to provide written consent for family/significant other to be provided Family/Significant Other Suicide Prevention Education during admission and/or prior to discharge.  Physician notified.  SPE completed with pt, as pt refused to consent to family contact. SPI pamphlet provided to pt and pt was encouraged to share information with support network, ask questions, and talk about any concerns relating to SPE. Pt denies access to guns/firearms and verbalized understanding of information provided. Mobile Crisis information also provided to pt.   [name] 03/05/2021, 1:01 PM

## 2021-03-05 NOTE — Progress Notes (Signed)
RN attempted to review discharge information with patient which include AVS, Prescriptions, BH Transition Record and Physician Suicide Risk Assessment. Patient was rude, yelling and cursing at nurse. Refused to review discharge instructions and look at belongings. When RN attempted to process with patient patient stated, "You can leave." Patient discharged with all belongings.

## 2021-03-05 NOTE — Progress Notes (Signed)
Recreation Therapy Notes  INPATIENT RECREATION TR PLAN  Patient Details Name: Anna [name] MRN: [id number] DOB: 23-Oct-1970 Today's Date: 03/05/2021  Rec Therapy Plan Is patient appropriate for Therapeutic Recreation?: Yes Treatment times per week: at least 3 Estimated Length of Stay: 5-7 days TR Treatment/Interventions: Group participation (Comment)  Discharge Criteria Pt will be discharged from therapy if:: Discharged Treatment plan/goals/alternatives discussed and agreed upon by:: Patient/family  Discharge Summary Short term goals set: Patient will engage in interactions with peers and staff in pro-social manner at least 2x within 5 recreation therapy group sessions Short term goals met: Not met Reason goals not met: Patient did not attend any groups Therapeutic equipment acquired: N/A Reason patient discharged from therapy: Discharge from hospital Pt/family agrees with progress & goals achieved: Yes Date patient discharged from therapy: 03/05/21   Summer Mccolgan 03/05/2021, 12:24 PM
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
## 2021-03-05 NOTE — Plan of Care (Signed)
Problem: Education: Goal: Knowledge of [facility] Education information/materials will improve Outcome: Adequate for Discharge Goal: Emotional status will improve 03/05/2021 1228 by Angeline Slim, RN Outcome: Adequate for Discharge 03/05/2021 1227 by Angeline Slim, RN Outcome: Progressing Goal: Mental status will improve 03/05/2021 1228 by Angeline Slim, RN Outcome: Adequate for Discharge 03/05/2021 1227 by Angeline Slim, RN Outcome: Progressing Goal: Verbalization of understanding the information provided will improve 03/05/2021 1228 by Angeline Slim, RN Outcome: Adequate for Discharge 03/05/2021 1227 by Angeline Slim, RN Outcome: Progressing   Problem: Activity: Goal: Interest or engagement in activities will improve 03/05/2021 1228 by Angeline Slim, RN Outcome: Adequate for Discharge 03/05/2021 1227 by Angeline Slim, RN Outcome: Progressing Goal: Sleeping patterns will improve 03/05/2021 1228 by Angeline Slim, RN Outcome: Adequate for Discharge 03/05/2021 1227 by Angeline Slim, RN Outcome: Progressing   Problem: Coping: Goal: Ability to verbalize frustrations and anger appropriately will improve 03/05/2021 1228 by Angeline Slim, RN Outcome: Adequate for Discharge 03/05/2021 1227 by Angeline Slim, RN Outcome: Progressing Goal: Ability to demonstrate self-control will improve 03/05/2021 1228 by Angeline Slim, RN Outcome: Adequate for Discharge 03/05/2021 1227 by Angeline Slim, RN Outcome: Progressing   Problem: Physical Regulation: Goal: Ability to maintain clinical measurements within normal limits will improve 03/05/2021 1228 by Angeline Slim, RN Outcome: Adequate for Discharge 03/05/2021 1227 by Angeline Slim, RN Outcome: Progressing   Problem: Safety: Goal: Periods of time without injury will increase 03/05/2021 1228 by Angeline Slim, RN Outcome: Adequate for Discharge 03/05/2021  1227 by Angeline Slim, RN Outcome: Progressing   Problem: Education: Goal: Ability to state activities that reduce stress will improve 03/05/2021 1228 by Angeline Slim, RN Outcome: Adequate for Discharge 03/05/2021 1227 by Angeline Slim, RN Outcome: Progressing   Problem: Coping: Goal: Ability to identify and develop effective coping behavior will improve 03/05/2021 1228 by Angeline Slim, RN Outcome: Adequate for Discharge 03/05/2021 1227 by Angeline Slim, RN Outcome: Progressing   Problem: Self-Concept: Goal: Ability to identify factors that promote anxiety will improve 03/05/2021 1228 by Angeline Slim, RN Outcome: Adequate for Discharge 03/05/2021 1227 by Angeline Slim, RN Outcome: Progressing Goal: Level of anxiety will decrease 03/05/2021 1228 by Angeline Slim, RN Outcome: Adequate for Discharge 03/05/2021 1227 by Angeline Slim, RN Outcome: Progressing Goal: Ability to modify response to factors that promote anxiety will improve 03/05/2021 1228 by Angeline Slim, RN Outcome: Adequate for Discharge 03/05/2021 1227 by Angeline Slim, RN Outcome: Progressing   Problem: Education: Goal: Utilization of techniques to improve thought processes will improve 03/05/2021 1228 by Angeline Slim, RN Outcome: Adequate for Discharge 03/05/2021 1227 by Angeline Slim, RN Outcome: Progressing Goal: Knowledge of the prescribed therapeutic regimen will improve 03/05/2021 1228 by Angeline Slim, RN Outcome: Adequate for Discharge 03/05/2021 1227 by Angeline Slim, RN Outcome: Progressing   Problem: Coping: Goal: Coping ability will improve 03/05/2021 1228 by Angeline Slim, RN Outcome: Adequate for Discharge 03/05/2021 1227 by Angeline Slim, RN Outcome: Progressing Goal: Will verbalize feelings 03/05/2021 1228 by Angeline Slim, RN Outcome: Adequate for Discharge 03/05/2021 1227 by Angeline Slim, RN Outcome:  Progressing   Problem: Health Behavior/Discharge Planning: Goal: Ability to make decisions will improve 03/05/2021 1228 by Angeline Slim, RN Outcome: Adequate for Discharge 03/05/2021 1227 by Angeline Slim, RN Outcome: Progressing Goal: Compliance with therapeutic regimen will improve 03/05/2021 1228 by Angeline Slim, RN Outcome: Adequate for Discharge 03/05/2021 1227 by Angeline Slim, RN Outcome: Progressing   Problem: Role Relationship: Goal: Will demonstrate positive changes in social behaviors and relationships 03/05/2021 1228 by Angeline Slim,  RN Outcome: Adequate for Discharge 03/05/2021 1227 by Angeline Slim, RN Outcome: Progressing   Problem: Safety: Goal: Ability to disclose and discuss suicidal ideas will improve 03/05/2021 1228 by Angeline Slim, RN Outcome: Adequate for Discharge 03/05/2021 1227 by Angeline Slim, RN Outcome: Progressing Goal: Ability to identify and utilize support systems that promote safety will improve 03/05/2021 1228 by Angeline Slim, RN Outcome: Adequate for Discharge 03/05/2021 1227 by Angeline Slim, RN Outcome: Progressing   Problem: Education: Goal: Ability to make informed decisions regarding treatment will improve 03/05/2021 1228 by Angeline Slim, RN Outcome: Adequate for Discharge 03/05/2021 1227 by Angeline Slim, RN Outcome: Progressing   Problem: Coping: Goal: Coping ability will improve 03/05/2021 1228 by Angeline Slim, RN Outcome: Adequate for Discharge 03/05/2021 1227 by Angeline Slim, RN Outcome: Progressing

## 2021-03-05 NOTE — Progress Notes (Signed)
Recreation Therapy Notes   Date: 03/05/2021  Time: 9:45 am     Location: Craft room   Behavioral response: N/A   Intervention Topic: Stress Management    Discussion/Intervention: Patient did not attend group.   Clinical Observations/Feedback:  Patient did not attend group.   Parveen Freehling LRT/CTRS        Estle Sabella 03/05/2021 11:41 AM

## 2021-03-05 NOTE — Progress Notes (Signed)
  [facility] Adult Case Management Discharge Plan :  Will you be returning to the same living situation after discharge:  Yes,  pt reports that she is returning to her [facility] At discharge, do you have transportation home?: Yes,  Pt reports that a friend will provide transportation.  Do you have the ability to pay for your medications: Yes,  Medicare Part A and B  Release of information consent forms completed and in the chart;  Patient's signature needed at discharge.  Patient to Follow up at:  Follow-up Information     [facility]   Why: Walk in hours are from 8:30AM to 12PM and 1PM to 2:30PM Monday through Friday, walk ins are first come first serve. Contact information: [address] [place] [address] [phone]                 Next level of care provider has access to [facility] Link:no  Safety Planning and Suicide Prevention discussed: Yes,  SPE completed with the patient.  Patient declined collateral contact.   Have you used any form of tobacco in the last 30 days? (Cigarettes, Smokeless Tobacco, Cigars, and/or Pipes): Yes  Has patient been referred to the Quitline?: Patient refused referral  Patient has been referred for addiction treatment: Pt. refused referral  [name], LCSW 03/05/2021, 12:58 PM

## 2021-03-05 NOTE — Group Note (Signed)
[facility] LCSW Group Therapy Note   Group Date: 03/05/2021 Start Time: 1300 End Time: 1400  Type of Therapy/Topic:  Group Therapy:  Feelings about Diagnosis  Participation Level:  Did Not Attend     Description of Group:    This group will allow patients to explore their thoughts and feelings about diagnoses they have received. Patients will be guided to explore their level of understanding and acceptance of these diagnoses. Facilitator will encourage patients to process their thoughts and feelings about the reactions of others to their diagnosis, and will guide patients in identifying ways to discuss their diagnosis with significant others in their lives. This group will be process-oriented, with patients participating in exploration of their own experiences as well as giving and receiving support and challenge from other group members.   Therapeutic Goals: 1. Patient will demonstrate understanding of diagnosis as evidence by identifying two or more symptoms of the disorder:  2. Patient will be able to express two feelings regarding the diagnosis 3. Patient will demonstrate ability to communicate their needs through discussion and/or role plays  Summary of Patient Progress:    X    Therapeutic Modalities:   Cognitive Behavioral Therapy Brief Therapy Feelings Identification    Anyra Kaufman, Student-Social Work 

## 2021-03-05 NOTE — Plan of Care (Signed)
  Problem: Group Participation Goal: STG - Patient will engage in interactions with peers and staff in pro-social manner at least 2x within 5 recreation therapy group sessions Description: STG - Patient will engage in interactions with peers and staff in pro-social manner at least 2x within 5 recreation therapy group sessions 03/05/2021 1223 by Ernest Haber, LRT Outcome: Not Applicable [id number] by Ernest Haber, LRT Outcome: Not Met (add Reason) Note: Patient did not attend any groups.

## 2021-03-05 NOTE — Plan of Care (Signed)
  Problem: Education: Goal: Emotional status will improve Outcome: Not Progressing Goal: Mental status will improve Outcome: Not Progressing Goal: Verbalization of understanding the information provided will improve Outcome: Progressing  Patient continues to endorse Passive SI with no plan, she is isolative and very minimal denies HI/A/VH at present and verbally contracted for safety. Support and encouragement provided.

## 2021-08-17 ENCOUNTER — Emergency Department (HOSPITAL_COMMUNITY)
Admission: EM | Admit: 2021-08-17 | Discharge: 2021-08-18 | Disposition: A | Discharge status: Home / Self Care | Payer: Medicare Other | Attending: Emergency Medicine | Admitting: Emergency Medicine

## 2021-08-17 ENCOUNTER — Other Ambulatory Visit: Payer: Self-pay

## 2021-08-17 ENCOUNTER — Encounter (HOSPITAL_COMMUNITY): Payer: Self-pay

## 2021-08-17 DIAGNOSIS — F1410 Cocaine abuse, uncomplicated: Secondary | ICD-10-CM | POA: Diagnosis not present

## 2021-08-17 DIAGNOSIS — M79601 Pain in right arm: Secondary | ICD-10-CM | POA: Insufficient documentation

## 2021-08-17 DIAGNOSIS — F329 Major depressive disorder, single episode, unspecified: Secondary | ICD-10-CM | POA: Diagnosis not present

## 2021-08-17 DIAGNOSIS — Z760 Encounter for issue of repeat prescription: Secondary | ICD-10-CM | POA: Insufficient documentation

## 2021-08-17 HISTORY — DX: Pure hyperglyceridemia: E78.1
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
## 2021-08-18 ENCOUNTER — Ambulatory Visit (INDEPENDENT_AMBULATORY_CARE_PROVIDER_SITE_OTHER)
Admission: EM | Admit: 2021-08-18 | Discharge: 2021-08-18 | Disposition: A | Discharge status: Home / Self Care | Payer: Medicare Other | Source: Home / Self Care

## 2021-08-18 ENCOUNTER — Emergency Department (HOSPITAL_COMMUNITY): Payer: Medicare Other

## 2021-08-18 ENCOUNTER — Ambulatory Visit (HOSPITAL_COMMUNITY)
Admission: RE | Admit: 2021-08-18 | Discharge: 2021-08-18 | Disposition: A | Discharge status: Home / Self Care | Payer: Medicare Other | Attending: Psychiatry | Admitting: Psychiatry

## 2021-08-18 DIAGNOSIS — F1410 Cocaine abuse, uncomplicated: Secondary | ICD-10-CM | POA: Insufficient documentation

## 2021-08-18 DIAGNOSIS — F329 Major depressive disorder, single episode, unspecified: Secondary | ICD-10-CM | POA: Insufficient documentation

## 2021-08-18 DIAGNOSIS — Z760 Encounter for issue of repeat prescription: Secondary | ICD-10-CM | POA: Insufficient documentation

## 2021-08-18 DIAGNOSIS — F330 Major depressive disorder, recurrent, mild: Secondary | ICD-10-CM
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
## 2021-08-18 NOTE — H&P (Deleted)
Behavioral Health Medical Screening Exam ? ?Anna [name] is a 51 y.o. female. ? ?Total Time spent with patient: 15 minutes ? ?Psychiatric Specialty Exam: ? ?Presentation  ?General Appearance: Appropriate for Environment ? ?Eye Contact:Good ? ?Speech:Clear and Coherent ? ?Speech Volume:Normal ? ?Handedness:Right ? ? ?Mood and Affect  ?Mood:Anxious; Depressed ? ?Affect:Congruent ? ? ?Thought Process  ?Thought Processes:Coherent ? ?Descriptions of Associations:Intact ? ?Orientation:Full (Time, Place and Person) ? ?Thought Content:Logical ? ?History of Schizophrenia/Schizoaffective disorder:No ? ?Duration of Psychotic Symptoms:N/A ? ?Hallucinations:Hallucinations: None ? ?Ideas of Reference:None ? ?Suicidal Thoughts:Suicidal Thoughts: No ? ?Homicidal Thoughts:Homicidal Thoughts: Yes, Passive ?HI Passive Intent and/or Plan: Without Intent; Without Means to Carry Out ? ? ?Sensorium  ?Memory:Immediate Fair; Recent Fair; Remote Fair ? ?Judgment:Fair ? ?Insight:Good ? ? ?Executive Functions  ?Concentration:Good ? ?Attention Span:Good ? ?Recall:Good ? ?Fund of Knowledge:Fair ? ?Language:Good ? ? ?Psychomotor Activity  ?Psychomotor Activity:Psychomotor Activity: Normal ? ?Assets  ?Assets:Social Support; Financial Resources/Insurance ? ? ?Sleep  ?Sleep:Sleep: Fair ? ? ?Physical Exam: ?Physical Exam ?ROS ?Blood pressure 95/64, pulse 84, temperature 98.9 ?F (37.2 ?C), temperature source Oral, resp. rate 16, height 5\' 8"  (1.727 m), weight 108.9 kg, last menstrual period 08/06/2019, SpO2 93 %. Body mass index is 36.49 kg/m?. ? ?Musculoskeletal: ?Strength & Muscle Tone: within normal limits ?Gait & Station: normal ?Patient leans: N/A ? ? ?Recommendations: ?Follow-up with walk in clinic at [facility] and or Family services of the [facility]   ? ?Based on my evaluation the patient does not appear to have an emergency medical condition. ? ?[place], NP ?08/18/2021, 8:27 AM ? ?

## 2021-08-18 NOTE — Discharge Instructions (Signed)
The x-ray of your right arm was normal.  There was no current fracture seen.  I recommend that you follow-up with primary care doctor. ?
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
## 2021-08-18 NOTE — Discharge Instructions (Signed)
Take all medications as prescribed. Keep all follow-up appointments as scheduled.  Do not consume alcohol or use illegal drugs while on prescription medications. Report any adverse effects from your medications to your primary care provider promptly.  In the event of recurrent symptoms or worsening symptoms, call 911, a crisis hotline, or go to the nearest emergency department for evaluation.   

## 2021-08-18 NOTE — ED Notes (Signed)
Pt given sandwich and drink per request
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
## 2021-08-20 ENCOUNTER — Emergency Department (HOSPITAL_COMMUNITY)
Admission: EM | Admit: 2021-08-20 | Discharge: 2021-08-20 | Disposition: A | Discharge status: Home / Self Care | Payer: Medicare Other | Attending: Emergency Medicine | Admitting: Emergency Medicine

## 2021-08-20 DIAGNOSIS — M79671 Pain in right foot: Secondary | ICD-10-CM | POA: Insufficient documentation

## 2021-08-20 DIAGNOSIS — F1910 Other psychoactive substance abuse, uncomplicated: Secondary | ICD-10-CM | POA: Diagnosis not present

## 2021-08-20 DIAGNOSIS — F4310 Post-traumatic stress disorder, unspecified: Secondary | ICD-10-CM | POA: Insufficient documentation

## 2021-08-20 DIAGNOSIS — M79672 Pain in left foot: Secondary | ICD-10-CM | POA: Insufficient documentation
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
## 2021-08-20 NOTE — Discharge Instructions (Signed)
Substance Abuse Treatment Programs ° °Intensive Outpatient Programs °[facility]     °[address]      °[place], [place]                   °[phone]      ° °[facility] °[address] °[place], [place] °[phone] ° °[facility]     °(Inpatient and outpatient)     °[address]           °[phone]   ° °[facility] °[phone] (Suboxone and Methadone) ° °[address]      °[place], [place] [address]      °[phone]      ° °[address] °[place], [place] °852-3033 ° °[facility] (Outpatient/Inpatient, Chemical)    °(insurance only) [phone]      °       °[facility] (Groups & Residential) °[place], [place] °[phone] ° °   °[facility]     °[address]     °[place], [place]      °[phone]      ° °[facility] (for caregivers and family) °[address] °[place], [place] °336-299-4655 ° ° ° ° ° °Residential Treatment Programs °[facility]      °[address], [place], [place] [address]  °[phone]      ° °[facility] °[address] [place], [place] [address] °[phone] ° °[facility]        °336-248-8914      ° °[facility] °[phone] ° °[facility]             °[address]                                         °[place], [place]                                                °877-615-2722 or 336-784-9470                              ° °[facility] [place] °[address] °[place], [address] °[phone] ° °[facility]    °[address]      °[place], [place]     °[phone]      ° °[facility] °[address] °[place], [place] °[phone] ° °[facility]   °[address]     °[place], [place] [address]     °[phone]      °Admissions: 8am-3pm M-F ° °[facility] °[address] °[place],  [place] °336-227-7417 ° °[facility]: Residential Program (90 Days)   °[place], [place]      °336-725-8389 or [phone]    ° °[facility] °[place], [place] °(Walk in Hours over the weekend or by referral) ° °[facility] °[address], [place], [place] [address] °([phone] ° °Crisis Mobile: [facility]:  1-877-626-1772 (for crisis response 24 hours a day) °[facility] Hotline:      1-800-256-2452 °Outpatient Psychiatry and Counseling ° °[facility]: Mobile Crisis   Management 24 hours:  1-877-626-1772 ° °[facility] sliding scale fee and walk in schedule: M-F 8am-12pm/1pm-3pm °[address]  °[place], [place] [address] °[phone] ° °[facility] °[address] °[place], [place] [address] °[phone] ° °[facility] (Formerly known [facility])- new patient walk-in appointments available Monday - Friday 8am -3pm.          °[address] °[place], [place] [address] °336-676-6840 or crisis line- 336-676-6905 ° °[facility]/ Intensive Outpatient Therapy Program °[address] °[place], [place] [address] °336-832-9804 ° °[facility]                  °Crisis Services      °[phone]      °[address]     °[place], [place] [address]                ° °[facility]   °[facility] °[phone] °[address] °[place], [place] [address] ° ° °[facility]          °[address]  °[place], [place] [address]       °[phone] ° °[facility] °[address] °[place], [place] [address] °336-292-1510 ° °[facility]    °[address]    °[place], [place] [address]     °[phone]      ° °Parish McKinney, MD     °[address]     °[place] [address]     °336-282-1251     °  °[facility] °[address] °[place] [address] ° °[facility]     °[address]     °[place], [place]      °336-542-2076      ° °[facility] °Shamsher Ahluwalia, MD °[address] °[place], [place] [address] °[phone] ° °[facility]     °[address]     °[place], [place] [address]     °336-274-1237      ° °[facility] °[address] °[place], [place] [address] °336-854-4450 °Resources for Temporary Residential Assistance/Crisis Centers ° °DAY CENTERS °[facility] °M-F 8am-3pm   °[address] [place], [place] [address]   336-332-0824 °Services include: laundry, barbering, support groups, case management, phone  & computer access, showers, AA/NA mtgs, mental health/substance abuse nurse, job skills class, disability information, VA assistance, spiritual classes, etc.  ° °HOMELESS SHELTERS ° °[facility]     °[facility]   °[address], [place]     °[phone]       °       °[facility] (women and children)       °[address] °[place], [place] [address] °336-275-0820 °[email or web address] for application and process °Application Required ° °[facility]   °[address]    °[place] [address]     °[phone]       °             °[facility] °[address] °[place],  [address] °336.273.5572 °336-235-0363(schedule application appt.) °Application Required ° °[facility] (women only)    °[address]     °[place],  [address]     °336-884-1039      °  Intake starts 6pm daily °Need valid ID, SSC, & Police report °[facility] [place] °[address] °[place], [place] °336-881-5420 °Application Required ° °[facility] (men only)     °[address]      °[place], [place]     °[phone]      ° °[facility] °(Pregnant women only) °[address] °[place], [place] °336-275-0206 ° °[facility]      °[address]      °[place], [place] [address]     °[phone]      °       °[facility] °[address] °[place], [place] °336-723-1848 °90 day commitment/SA/Application process ° °[facility](men only)     °[address]     °[place], [place]     °[phone]       °Check-in at 7pm     °       °[facility] °[address] °[place], [place] [address] °336-248-6684 °Men/Women/Women and Children must be there by 7 pm ° °[facility] °[place], [place] °336-722-8721                ° °

## 2021-08-20 NOTE — Progress Notes (Signed)
TOC CSW received consult in regards to transportation. Pt can receive a [place] and PART bus pass back upon d/c. TOC is unable to provide 3hr ride to [place]. ? ? ?Valentina Shaggy.Avory Rahimi, MSW, LCSWA ?[facility]  Transitions of Care ?Clinical Social Worker I ?Direct Dial: [phone]  Fax: [phone] ?[email or web address]    ?

## 2021-08-20 NOTE — ED Triage Notes (Signed)
Ems brings pt in for bilateral foot pain. Pt also reports SI and HI. ?

## 2021-10-03 ENCOUNTER — Other Ambulatory Visit: Payer: Self-pay

## 2021-10-03 ENCOUNTER — Emergency Department (HOSPITAL_COMMUNITY)
Admission: EM | Admit: 2021-10-03 | Discharge: 2021-10-03 | Disposition: A | Discharge status: Home / Self Care | Payer: Medicare Other | Attending: Emergency Medicine | Admitting: Emergency Medicine

## 2021-10-03 ENCOUNTER — Encounter (HOSPITAL_COMMUNITY): Payer: Self-pay | Admitting: Emergency Medicine

## 2021-10-03 DIAGNOSIS — N390 Urinary tract infection, site not specified: Secondary | ICD-10-CM | POA: Diagnosis not present

## 2021-10-03 DIAGNOSIS — R109 Unspecified abdominal pain: Secondary | ICD-10-CM | POA: Diagnosis not present

## 2021-10-03 DIAGNOSIS — R300 Dysuria: Secondary | ICD-10-CM | POA: Insufficient documentation

## 2021-10-03 LAB — URINALYSIS, ROUTINE W REFLEX MICROSCOPIC
Bilirubin Urine: NEGATIVE
Glucose, UA: NEGATIVE mg/dL
Ketones, ur: 5 mg/dL — AB
Nitrite: NEGATIVE
Protein, ur: 100 mg/dL — AB
Specific Gravity, Urine: 1.025 (ref 1.005–1.030)
WBC, UA: >50 WBC/hpf — ABNORMAL HIGH (ref 0–5)
pH: 6 (ref 5.0–8.0)

## 2021-10-03 LAB — HIV ANTIBODY (ROUTINE TESTING W REFLEX): HIV Screen 4th Generation wRfx: NONREACTIVE

## 2021-10-03 LAB — RPR: RPR Ser Ql: NONREACTIVE

## 2021-10-03 MED ORDER — CEPHALEXIN 500 MG PO CAPS: 500.0000 mg | ORAL_CAPSULE | Freq: Four times a day (QID) | ORAL | 0 refills | Status: DC

## 2021-10-03 NOTE — ED Notes (Signed)
Pt requests that we call Nedra Hai with any results- [phone]. Pt does not have a phone.

## 2021-10-03 NOTE — ED Triage Notes (Signed)
Pt states she has history of UTIs. Within the last two days, pt developed pressure in her bladder, frequency with urination, burning with urination. Pt feels like her bladder is having spasms.
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
## 2021-10-03 NOTE — Discharge Instructions (Signed)
You will be contacted if infection show up in the cultures that are not covered by the antibiotics.

## 2021-10-04 LAB — GC/CHLAMYDIA PROBE AMP (~~LOC~~) NOT AT ARMC
Chlamydia: NEGATIVE
Comment: NEGATIVE
Comment: NORMAL
Neisseria Gonorrhea: POSITIVE — AB

## 2021-10-05 LAB — URINE CULTURE: Culture: >=100000 — AB

## 2021-10-06 ENCOUNTER — Telehealth (HOSPITAL_BASED_OUTPATIENT_CLINIC_OR_DEPARTMENT_OTHER): Payer: Self-pay | Admitting: *Deleted

## 2021-10-06 NOTE — Progress Notes (Signed)
ED Antimicrobial Stewardship Positive Culture Follow Up   Anna [name] is an 51 y.o. female who presented to [facility] on 10/03/2021 with a chief complaint of  Chief Complaint  Patient presents with   Recurrent UTI    Recent Results (from the past 720 hour(s))  Urine Culture     Status: Abnormal   Collection Time: 10/03/21  9:08 AM   Specimen: Urine, Clean Catch  Result Value Ref Range Status   Specimen Description URINE, CLEAN CATCH  Final   Special Requests   Final    NONE Performed at [facility], [address] [place], [place] [address]    Culture >=100,000 COLONIES/mL STAPHYLOCOCCUS SAPROPHYTICUS (A)  Final   Report Status 10/05/2021 FINAL  Final   Organism ID, Bacteria STAPHYLOCOCCUS SAPROPHYTICUS (A)  Final      Susceptibility   Staphylococcus saprophyticus - MIC*    CIPROFLOXACIN <=0.5 SENSITIVE Sensitive     GENTAMICIN <=0.5 SENSITIVE Sensitive     NITROFURANTOIN <=16 SENSITIVE Sensitive     OXACILLIN 2 RESISTANT Resistant     TETRACYCLINE <=1 SENSITIVE Sensitive     VANCOMYCIN 1 SENSITIVE Sensitive     TRIMETH/SULFA <=10 SENSITIVE Sensitive     CLINDAMYCIN <=0.25 SENSITIVE Sensitive     RIFAMPIN <=0.5 SENSITIVE Sensitive     Inducible Clindamycin NEGATIVE Sensitive     * >=100,000 COLONIES/mL STAPHYLOCOCCUS SAPROPHYTICUS    [x]  Treated with keflex, organism resistant to prescribed antimicrobial [x]  Chart review also reveals positive gonorrhea which was not treated. Will need education and follow [facility] policies for any required reporting.  STOP Keflex Start: Cefixime 800mg  PO once (for gonorrhea) 2.   Macrobid 100mg  BID x 5 days (Qty 10; Refills 0) (Staph sarpphyticus UTI)  ED Provider: , PharmD, BCPS 10/06/2021 11:03 AM ED Clinical Pharmacist -  [phone]

## 2021-10-07 ENCOUNTER — Telehealth (HOSPITAL_BASED_OUTPATIENT_CLINIC_OR_DEPARTMENT_OTHER): Payer: Self-pay | Admitting: Emergency Medicine

## 2021-10-07 NOTE — Telephone Encounter (Signed)
Post ED Visit - Positive Culture Follow-up: Unsuccessful Patient Follow-up  Culture assessed and recommendations reviewed by:  []  , Pharm.D. []  Enzo Bi, Pharm.D., BCPS AQ-ID []  , Pharm.D., BCPS []  Celedonio Miyamoto, .D., BCPS []  [place], .D., BCPS, AAHIVP []  Georgina Pillion, Pharm.D., BCPS, AAHIVP [x]  [address], PharmD []  , PharmD, BCPS  Positive urine culture  [x]  Patient discharged without antimicrobial prescription and treatment is now indicated []  Organism is resistant to prescribed ED discharge antimicrobial []  Patient with positive blood cultures   Unable to contact patient via phone number specified by patient to call [phone] - left voicemail to return call, letter will be sent to address on file  Plan: Stop Keflex Start: Macrobid 100 mg BID for five days - [address] [place] Cefixime 800 mg PO x one dose - [place]  Anna [name] 10/07/2021, 5:00 AM

## 2021-10-12 ENCOUNTER — Emergency Department (HOSPITAL_COMMUNITY): Payer: Medicare Other

## 2021-10-12 ENCOUNTER — Other Ambulatory Visit: Payer: Self-pay

## 2021-10-12 ENCOUNTER — Encounter (HOSPITAL_COMMUNITY): Payer: Self-pay | Admitting: Emergency Medicine

## 2021-10-12 ENCOUNTER — Ambulatory Visit (HOSPITAL_COMMUNITY)
Admission: RE | Admit: 2021-10-12 | Discharge: 2021-10-12 | Disposition: A | Discharge status: Home / Self Care | Payer: Medicare Other | Source: Ambulatory Visit | Attending: Emergency Medicine | Admitting: Emergency Medicine

## 2021-10-12 ENCOUNTER — Emergency Department (HOSPITAL_COMMUNITY)
Admission: EM | Admit: 2021-10-12 | Discharge: 2021-10-12 | Discharge status: Against Medical Advice | Payer: Medicare Other | Attending: Emergency Medicine | Admitting: Emergency Medicine

## 2021-10-12 DIAGNOSIS — Z5321 Procedure and treatment not carried out due to patient leaving prior to being seen by health care provider: Secondary | ICD-10-CM | POA: Insufficient documentation

## 2021-10-12 DIAGNOSIS — M25551 Pain in right hip: Secondary | ICD-10-CM | POA: Insufficient documentation

## 2021-10-12 NOTE — ED Notes (Signed)
Pt called and unable to locate in lobby.

## 2021-10-12 NOTE — ED Notes (Signed)
Patient left out of xray prior to films
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
## 2021-10-13 ENCOUNTER — Emergency Department (HOSPITAL_COMMUNITY): Payer: Medicare Other

## 2021-10-13 ENCOUNTER — Emergency Department (HOSPITAL_COMMUNITY)
Admission: EM | Admit: 2021-10-13 | Discharge: 2021-10-13 | Disposition: A | Discharge status: Home / Self Care | Payer: Medicare Other | Attending: Emergency Medicine | Admitting: Emergency Medicine

## 2021-10-13 ENCOUNTER — Other Ambulatory Visit: Payer: Self-pay

## 2021-10-13 ENCOUNTER — Encounter (HOSPITAL_COMMUNITY): Payer: Self-pay

## 2021-10-13 ENCOUNTER — Emergency Department (HOSPITAL_COMMUNITY)
Admission: EM | Admit: 2021-10-13 | Discharge: 2021-10-13 | Discharge status: Against Medical Advice | Payer: Medicare Other

## 2021-10-13 DIAGNOSIS — Z5900 Homelessness unspecified: Secondary | ICD-10-CM | POA: Insufficient documentation

## 2021-10-13 DIAGNOSIS — S8001XA Contusion of right knee, initial encounter: Secondary | ICD-10-CM | POA: Insufficient documentation

## 2021-10-13 DIAGNOSIS — M25551 Pain in right hip: Secondary | ICD-10-CM | POA: Diagnosis not present

## 2021-10-13 DIAGNOSIS — S8991XA Unspecified injury of right lower leg, initial encounter: Secondary | ICD-10-CM | POA: Diagnosis present

## 2021-10-13 DIAGNOSIS — S2002XA Contusion of left breast, initial encounter: Secondary | ICD-10-CM | POA: Insufficient documentation

## 2021-10-13 DIAGNOSIS — A549 Gonococcal infection, unspecified: Secondary | ICD-10-CM | POA: Insufficient documentation

## 2021-10-13 MED ORDER — IBUPROFEN 800 MG PO TABS
800.0000 mg | ORAL_TABLET | Freq: Once | ORAL | Status: AC
  Administered 2021-10-13: 800 mg via ORAL
  Filled 2021-10-13: qty 1

## 2021-10-13 MED ORDER — ACETAMINOPHEN 500 MG PO TABS
1000.0000 mg | ORAL_TABLET | Freq: Once | ORAL | Status: AC
  Administered 2021-10-13: 1000 mg via ORAL
  Filled 2021-10-13: qty 2

## 2021-10-13 MED ORDER — LIDOCAINE HCL 1 % IJ SOLN
INTRAMUSCULAR | Status: AC
  Administered 2021-10-13: 2.1 mL
  Filled 2021-10-13: qty 20

## 2021-10-13 MED ORDER — CEFTRIAXONE SODIUM 1 G IJ SOLR
500.0000 mg | Freq: Once | INTRAMUSCULAR | Status: AC
  Administered 2021-10-13: 500 mg via INTRAMUSCULAR
  Filled 2021-10-13: qty 10

## 2021-10-13 NOTE — ED Notes (Signed)
PT was called for triage, no answer

## 2021-10-13 NOTE — Discharge Instructions (Signed)
All the x-rays today were normal there is nothing broken but you are bruised and will be sore.  Also you did test positive for gonorrhea and you were treated for that today.  A repeat HIV test was done and if anything is abnormal someone will attempt to contact you.
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
## 2021-10-13 NOTE — ED Notes (Signed)
Called pt for triage x4, no response.

## 2021-10-14 LAB — HIV ANTIBODY (ROUTINE TESTING W REFLEX): HIV Screen 4th Generation wRfx: NONREACTIVE

## 2021-10-18 ENCOUNTER — Encounter (HOSPITAL_COMMUNITY): Payer: Self-pay | Admitting: Emergency Medicine

## 2021-10-18 ENCOUNTER — Other Ambulatory Visit: Payer: Self-pay

## 2021-10-18 ENCOUNTER — Emergency Department (HOSPITAL_COMMUNITY)
Admission: EM | Admit: 2021-10-18 | Discharge: 2021-10-18 | Disposition: A | Discharge status: Home / Self Care | Payer: Medicare Other | Attending: Emergency Medicine | Admitting: Emergency Medicine

## 2021-10-18 DIAGNOSIS — K029 Dental caries, unspecified: Secondary | ICD-10-CM | POA: Insufficient documentation

## 2021-10-18 DIAGNOSIS — K0889 Other specified disorders of teeth and supporting structures: Secondary | ICD-10-CM
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
## 2021-10-18 NOTE — ED Triage Notes (Signed)
Pt c/o dental pain. 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

## 2021-10-18 NOTE — Discharge Instructions (Signed)
Return for any problem.  ?
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
## 2021-10-22 ENCOUNTER — Emergency Department (HOSPITAL_COMMUNITY)
Admission: EM | Admit: 2021-10-22 | Discharge: 2021-10-25 | Disposition: A | Discharge status: Home / Self Care | Payer: Medicare Other | Attending: Emergency Medicine | Admitting: Emergency Medicine

## 2021-10-22 ENCOUNTER — Other Ambulatory Visit: Payer: Self-pay

## 2021-10-22 ENCOUNTER — Encounter (HOSPITAL_COMMUNITY): Payer: Self-pay | Admitting: Oncology

## 2021-10-22 DIAGNOSIS — R451 Restlessness and agitation: Secondary | ICD-10-CM | POA: Insufficient documentation

## 2021-10-22 DIAGNOSIS — F603 Borderline personality disorder: Secondary | ICD-10-CM | POA: Diagnosis present

## 2021-10-22 DIAGNOSIS — Z5900 Homelessness unspecified: Secondary | ICD-10-CM | POA: Diagnosis not present

## 2021-10-22 DIAGNOSIS — F411 Generalized anxiety disorder: Secondary | ICD-10-CM | POA: Diagnosis present

## 2021-10-22 DIAGNOSIS — Z91148 Patient's other noncompliance with medication regimen for other reason: Secondary | ICD-10-CM | POA: Insufficient documentation

## 2021-10-22 DIAGNOSIS — F1410 Cocaine abuse, uncomplicated: Secondary | ICD-10-CM

## 2021-10-22 DIAGNOSIS — Z20822 Contact with and (suspected) exposure to covid-19: Secondary | ICD-10-CM | POA: Diagnosis not present

## 2021-10-22 DIAGNOSIS — F1420 Cocaine dependence, uncomplicated: Secondary | ICD-10-CM | POA: Diagnosis present

## 2021-10-22 DIAGNOSIS — T7421XA Adult sexual abuse, confirmed, initial encounter: Secondary | ICD-10-CM | POA: Diagnosis not present

## 2021-10-22 DIAGNOSIS — F22 Delusional disorders: Secondary | ICD-10-CM | POA: Insufficient documentation

## 2021-10-22 DIAGNOSIS — F332 Major depressive disorder, recurrent severe without psychotic features: Secondary | ICD-10-CM | POA: Diagnosis present

## 2021-10-22 DIAGNOSIS — F4310 Post-traumatic stress disorder, unspecified: Secondary | ICD-10-CM | POA: Diagnosis not present

## 2021-10-22 DIAGNOSIS — Z79899 Other long term (current) drug therapy: Secondary | ICD-10-CM | POA: Diagnosis not present

## 2021-10-22 DIAGNOSIS — F430 Acute stress reaction: Secondary | ICD-10-CM

## 2021-10-22 LAB — COMPREHENSIVE METABOLIC PANEL
ALT: 15 U/L (ref 0–44)
AST: 18 U/L (ref 15–41)
Albumin: 3.7 g/dL (ref 3.5–5.0)
Alkaline Phosphatase: 79 U/L (ref 38–126)
Anion gap: 10 (ref 5–15)
BUN: 10 mg/dL (ref 6–20)
CO2: 20 mmol/L — ABNORMAL LOW (ref 22–32)
Calcium: 9.3 mg/dL (ref 8.9–10.3)
Chloride: 113 mmol/L — ABNORMAL HIGH (ref 98–111)
Creatinine, Ser: 0.79 mg/dL (ref 0.44–1.00)
GFR, Estimated: >60 mL/min (ref >60)
Glucose, Bld: 116 mg/dL — ABNORMAL HIGH (ref 70–99)
Potassium: 3.7 mmol/L (ref 3.5–5.1)
Sodium: 143 mmol/L (ref 135–145)
Total Bilirubin: 0.4 mg/dL (ref 0.3–1.2)
Total Protein: 6.5 g/dL (ref 6.5–8.1)

## 2021-10-22 LAB — CBC WITH DIFFERENTIAL/PLATELET
Abs Immature Granulocytes: 0.03 10*3/uL (ref 0.00–0.07)
Basophils Absolute: 0 10*3/uL (ref 0.0–0.1)
Basophils Relative: 1 %
Eosinophils Absolute: 0.1 10*3/uL (ref 0.0–0.5)
Eosinophils Relative: 1 %
HCT: 48.1 % — ABNORMAL HIGH (ref 36.0–46.0)
Hemoglobin: 14.3 g/dL (ref 12.0–15.0)
Immature Granulocytes: 1 %
Lymphocytes Relative: 30 %
Lymphs Abs: 1.9 10*3/uL (ref 0.7–4.0)
MCH: 29.5 pg (ref 26.0–34.0)
MCHC: 29.7 g/dL — ABNORMAL LOW (ref 30.0–36.0)
MCV: 99.2 fL (ref 80.0–100.0)
Monocytes Absolute: 0.4 10*3/uL (ref 0.1–1.0)
Monocytes Relative: 6 %
Neutro Abs: 3.9 10*3/uL (ref 1.7–7.7)
Neutrophils Relative %: 61 %
Platelets: 250 10*3/uL (ref 150–400)
RBC: 4.85 MIL/uL (ref 3.87–5.11)
RDW: 13.6 % (ref 11.5–15.5)
WBC: 6.3 10*3/uL (ref 4.0–10.5)
nRBC: 0 % (ref 0.0–0.2)

## 2021-10-22 LAB — RESP PANEL BY RT-PCR (FLU A&B, COVID) ARPGX2
Influenza A by PCR: NEGATIVE
Influenza B by PCR: NEGATIVE
SARS Coronavirus 2 by RT PCR: NEGATIVE

## 2021-10-22 LAB — PREGNANCY, URINE: Preg Test, Ur: NEGATIVE

## 2021-10-22 LAB — RAPID URINE DRUG SCREEN, HOSP PERFORMED
Amphetamines: NOT DETECTED
Barbiturates: NOT DETECTED
Benzodiazepines: NOT DETECTED
Cocaine: POSITIVE — AB
Opiates: NOT DETECTED
Tetrahydrocannabinol: POSITIVE — AB

## 2021-10-22 LAB — ETHANOL: Alcohol, Ethyl (B): <10 mg/dL (ref <10)

## 2021-10-22 MED ORDER — STERILE WATER FOR INJECTION IJ SOLN
INTRAMUSCULAR | Status: AC
  Filled 2021-10-22: qty 10

## 2021-10-22 MED ORDER — LORAZEPAM 2 MG/ML IJ SOLN
2.0000 mg | Freq: Once | INTRAMUSCULAR | Status: AC
  Administered 2021-10-22: 2 mg via INTRAMUSCULAR
  Filled 2021-10-22: qty 1

## 2021-10-22 MED ORDER — IBUPROFEN 200 MG PO TABS
600.0000 mg | ORAL_TABLET | Freq: Three times a day (TID) | ORAL | Status: DC | PRN
  Administered 2021-10-22 – 2021-10-25 (×5): 600 mg via ORAL
  Filled 2021-10-22 (×5): qty 3

## 2021-10-22 MED ORDER — ZIPRASIDONE MESYLATE 20 MG IM SOLR
20.0000 mg | INTRAMUSCULAR | Status: AC | PRN
  Administered 2021-10-23: 20 mg via INTRAMUSCULAR
  Filled 2021-10-22: qty 20

## 2021-10-22 MED ORDER — ZIPRASIDONE MESYLATE 20 MG IM SOLR
20.0000 mg | Freq: Once | INTRAMUSCULAR | Status: AC
  Administered 2021-10-22: 20 mg via INTRAMUSCULAR
  Filled 2021-10-22: qty 20

## 2021-10-22 MED ORDER — LORAZEPAM 1 MG PO TABS
1.0000 mg | ORAL_TABLET | Freq: Once | ORAL | Status: DC
  Filled 2021-10-22: qty 1

## 2021-10-22 MED ORDER — LORAZEPAM 1 MG PO TABS: 1.0000 mg | ORAL_TABLET | ORAL | Status: DC | PRN

## 2021-10-22 MED ORDER — OLANZAPINE 5 MG PO TBDP: 5.0000 mg | ORAL_TABLET | Freq: Three times a day (TID) | ORAL | Status: DC | PRN

## 2021-10-22 NOTE — ED Notes (Signed)
Pt still asleep 
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
## 2021-10-22 NOTE — Consult Note (Addendum)
Arrived to see patient. Patient is sleeping soundly. Does not respond to verbal or tactile stimulation. Talked with Jeanmarie Hubert, EMT-P and Achille Rich, PA-C to request notification of on call SANE/FNE when patient is awake and alert or in the event that the patient is going to be transferred out of the ED. On coming SANE, Bonita Quin, is aware of patient and will check on her status periodically throughout the night.

## 2021-10-22 NOTE — ED Notes (Signed)
PT refuse to speak with SANE nurse. Triage and fast track nurse have been made aware.
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
## 2021-10-22 NOTE — ED Notes (Signed)
Pt sitting up EOB, eating dinner

## 2021-10-22 NOTE — Consult Note (Addendum)
Contacted by Riki Sheer, PA-C. He states patient has been medicated and will likely not be able to consent for some time. Notified Roxan Hockey that another consult is pending at [facility] and requested to place patient second in que behind the patient at [facility] due to patient's present condition. He is in agreement with plan.

## 2021-10-22 NOTE — ED Notes (Signed)
Patient is now quiet, has stopped yelling at this time.  Patient lying on left side with a blanket covering her.  Respirations even and unlabored.

## 2021-10-22 NOTE — ED Notes (Signed)
Attempted to collect blood x 2 w/o success.

## 2021-10-22 NOTE — ED Notes (Signed)
Patient informed this RN that her tooth hurts.  Provider made aware.
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
## 2021-10-22 NOTE — ED Notes (Signed)
Patient currently rolling around in bed yelling that her tooth hurts.  Patient is not yelling at staff and was cooperative with taking ordered PRN.

## 2021-10-22 NOTE — ED Notes (Signed)
This RN spoke with SANE RN and they advised they would be down to speak with patient.
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
## 2021-10-22 NOTE — ED Notes (Signed)
Pt became belligerent immediately upon arrival. Multiple attempts by this writer to verbally deescalate pt w/o success. Pt screaming and charging at staff. Pt agreeable to IM geodon however while administering medication pt began yanking her arm away causing risk to all involved. Dr. Jeraldine Loots to triage. Security in triage for additional support. Per Dr. Jeraldine Loots, pt is now IVC. Stretcher brought to triage area pt placed on stretcher in 4 point hard restraints until successful effect of medication. Pt continues screaming.

## 2021-10-22 NOTE — ED Notes (Signed)
SANE RN at bedside.

## 2021-10-22 NOTE — Consult Note (Signed)
Riki Sheer, PA-C requested a SANE consult but states the patient is combative and he will need to call me back.

## 2021-10-23 MED ORDER — ACETAMINOPHEN 500 MG PO TABS
1000.0000 mg | ORAL_TABLET | Freq: Once | ORAL | Status: AC
  Administered 2021-10-23: 1000 mg via ORAL
  Filled 2021-10-23: qty 2

## 2021-10-23 MED ORDER — STERILE WATER FOR INJECTION IJ SOLN
INTRAMUSCULAR | Status: AC
  Filled 2021-10-23: qty 10

## 2021-10-23 NOTE — ED Notes (Signed)
Introduced myself to pt and she was cooperative and non combative at that time. She wanted to know if the SANE RN was coming to evaluate her. She stated the last time someone came to see her she "just couldn't".
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
## 2021-10-23 NOTE — ED Notes (Signed)
Sindy Guadeloupe, NP recommends inpatient treatment, after her TTS consult.

## 2021-10-23 NOTE — Progress Notes (Signed)
Patient has been denied by [facility] due to no appropriate beds available. Patient meets BH inpatient criteria per Sindy Guadeloupe, NP. Patient has been faxed out to the following facilities:    [facility]  [address] [place], [place] [address] [phone]  [facility]  [address] [place] [address] [phone]  [facility]  [address] [place] [address] [phone]  [facility]  [address] [place] [address] [phone]  [facility]  [address] [place] [address] [phone]  [facility]  [address] [place] [place] [address] [phone]  [facility]  [address], [place] [address] [phone]  [facility]  [address] [place] [address] [phone]  [facility]  [address], [place] [address] [phone]  [facility]  [address] [place], [place] [address] [phone]  [facility]  [address], [place] [address] [phone]  [facility]  [address] [place], [place] [address] [phone]  [facility]  [address] [place] [address] [phone]  [facility]  [address], [place] [address] [phone]   Damita Dunnings, MSW, LCSW-A  9:37 PM 10/23/2021

## 2021-10-23 NOTE — ED Notes (Signed)
Security at bedside
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
## 2021-10-23 NOTE — BH Assessment (Addendum)
Clinician attempted to engage the pt in TTS assessment however pt answered very few questions, would dose off and did not fully engage. Per Sheria Lang, RN pt is uncooperative in answering questions and aggressive. Clinician asked RN to let her know when the pt is alert and able to engage.    Redmond Pulling, MS, [facility], [facility] Triage Specialist [phone]

## 2021-10-23 NOTE — ED Notes (Signed)
As this Clinical research associate went into to get her temperature, she became very belligerent and demanded to know some information. I told her that I was unsure what the furthe plan was and she was not accepting this information. She was told that the "plan was to get (her) to an inpatient psych facility" and she stated she did not want to do that and that she "would not cooperate".   She then approached the desk being very belligerent and rude. She raised her voice and refused to go back to her room. Another RN was able to redirect her and had a conversation with her and explained to her the current situation and plan.

## 2021-10-23 NOTE — BH Assessment (Signed)
BHH Assessment Progress Note   Per Sindy Guadeloupe, NP, this pt requires psychiatric hospitalization at this time.  Pt presents under IVC initiated by EDP Gerhard Munch, MD.  [facility] will not be able to accommodate this pt today.  At the direction of Nelly Rout, MD this writer has sought placement for pt at facilities outside of the [facility] system.  The following facilities have been contacted to seek placement for this pt, with results as noted:  Beds available, information sent, decision pending: [facility]  At capacity: [facility]   Doylene Canning, [place] Behavioral Health Coordinator [phone]
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
## 2021-10-23 NOTE — ED Notes (Addendum)
Patient has changed into appropriate scrubs. Patients belongings placed in triage nursing station, one white bag that contains clothes. Patient states that she came in with a bible and necklace but there's no documentation about items.

## 2021-10-23 NOTE — ED Notes (Signed)
Patient informed about disposition/recommendation at an inpatient psychiatric facility.
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
## 2021-10-23 NOTE — ED Notes (Signed)
Patient attempting TTS again.
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
## 2021-10-23 NOTE — ED Notes (Signed)
TTS at bedside. 

## 2021-10-23 NOTE — BH Assessment (Signed)
Clinician messages Roda Shutters. Katrinka Blazing, RN: "Hey. It's Trey with TTS. Is the pt able to engage in the assessment, if so the pt will need to be placed in a private room. Is the pt under IVC? Also is the pt medically cleared?"   Clinician awaiting response.     Redmond Pulling, MS, [facility], [facility] Triage Specialist [phone]

## 2021-10-23 NOTE — ED Notes (Signed)
Pt requesting to speak with SANE nurse, this RN spoke with SANE nurse who will come to see patient.

## 2021-10-24 DIAGNOSIS — R451 Restlessness and agitation: Secondary | ICD-10-CM | POA: Insufficient documentation

## 2021-10-24 DIAGNOSIS — F1420 Cocaine dependence, uncomplicated: Secondary | ICD-10-CM | POA: Diagnosis not present

## 2021-10-24 DIAGNOSIS — F332 Major depressive disorder, recurrent severe without psychotic features: Secondary | ICD-10-CM | POA: Diagnosis not present

## 2021-10-24 DIAGNOSIS — F603 Borderline personality disorder: Secondary | ICD-10-CM | POA: Diagnosis not present

## 2021-10-24 MED ORDER — MELATONIN 5 MG PO TABS
5.0000 mg | ORAL_TABLET | Freq: Every day | ORAL | Status: DC
  Administered 2021-10-24: 5 mg via ORAL
  Filled 2021-10-24: qty 1

## 2021-10-24 MED ORDER — HYDROXYZINE HCL 25 MG PO TABS
25.0000 mg | ORAL_TABLET | Freq: Three times a day (TID) | ORAL | Status: DC | PRN
  Administered 2021-10-25: 25 mg via ORAL
  Filled 2021-10-24: qty 1

## 2021-10-24 MED ORDER — PRAZOSIN HCL 1 MG PO CAPS
1.0000 mg | ORAL_CAPSULE | Freq: Every day | ORAL | Status: DC
  Administered 2021-10-24: 1 mg via ORAL
  Filled 2021-10-24: qty 1

## 2021-10-24 MED ORDER — DOXEPIN HCL 25 MG PO CAPS: 25.0000 mg | ORAL_CAPSULE | Freq: Every evening | ORAL | Status: DC | PRN

## 2021-10-24 MED ORDER — OLANZAPINE 5 MG PO TABS
2.5000 mg | ORAL_TABLET | Freq: Every day | ORAL | Status: DC
  Filled 2021-10-24: qty 1

## 2021-10-24 MED ORDER — ACETAMINOPHEN 500 MG PO TABS
1000.0000 mg | ORAL_TABLET | Freq: Once | ORAL | Status: AC
  Administered 2021-10-24: 1000 mg via ORAL
  Filled 2021-10-24: qty 2
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
## 2021-10-24 NOTE — ED Notes (Signed)
Pt's breakfast has arrived, pt sitting up and eating her breakfast.  
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
## 2021-10-24 NOTE — ED Notes (Signed)
Pt awake, Clinical research associate and pt talked for a while about pt and her court date. Writer assure pt that a case Catering manager can change the date to it to another date for her. Pt also requested something to drink/eat, due to breakfast trays arriving late, writer provided pt with a breakfast sandwich and an orange juice. Pt sitting up and watching tv

## 2021-10-24 NOTE — ED Notes (Signed)
Pt making her first phone call of the day, pt made aware of the hospital rules. Pt compliant with rules

## 2021-10-24 NOTE — ED Notes (Signed)
Patient given Tylenol per request.  States she is having dental pain and would like to alternate Motrin if possible

## 2021-10-24 NOTE — Progress Notes (Signed)
Misty Stanley from [facility] called to report that pt is now PENDING acceptance for tomorrow per their discharges.  Stacy advised to follow up in the morning and believes that there should be additional discharges added but Misty Stanley informed that there maybe a possible delay.   Nursing notified:Scott Laffey, RN  Maryjean Ka, MSW, [facility] 10/24/2021 11:16 PM

## 2021-10-24 NOTE — ED Notes (Signed)
Psychiatry talking to pt 

## 2021-10-24 NOTE — ED Notes (Addendum)
Per Staci Acosta, LCSW:  Pt was accepted to [facility] tomorrow 10/24/21   Pt meets inpatient criteria per Nira Conn, NP  Attending Physician will be Dr. Nolon Rod  Report can be called to: [phone]  Pt can arrive after 8:00am  Nursing notified:Nyaira Hodgens, RN  2317: status has been changed to PENDING per their discharges tomorrow.

## 2021-10-24 NOTE — ED Notes (Signed)
Patient becoming increasingly agitated due to noise in hallway.  Has been unable to sleep and does not want medication to help her sleep.  Patient moved to room at this time to decrease noise and change environment.  Patient has been calm and cooperative during shift.

## 2021-10-24 NOTE — ED Notes (Signed)
Pt's lunch has arrived, pt sitting up and eating her lunch 

## 2021-10-24 NOTE — ED Notes (Signed)
Pt requested that Vedia Pereyra of Court be notified of patients admission. Vedia Pereyra of Court notified that patient is unable to attend court today. Pt notified that call was made. Pt calm and cooperative at this time.

## 2021-10-24 NOTE — Progress Notes (Addendum)
Pt was accepted to [facility] tomorrow 10/24/21   Pt meets inpatient criteria per Nira Conn, NP  Attending Physician will be Dr. Nolon Rod  Report can be called to: [phone]  Pt can arrive after 8:00am  Nursing notified:Scott Laffey, RN  Kelton Pillar, LCSWA 10/24/2021 @ 8:57 PM

## 2021-10-24 NOTE — BH Assessment (Signed)
[facility] Assessment Progress Note   Per Nira Conn, NP, this involuntary pt continues to require psychiatric hospitalization at this time.  [facility] is unlikely to be able to accommodate this pt today.  The following facilities have been contacted to seek placement for this pt, with results as noted:  Beds available, information sent, decision pending: Earlene Plater [facility] [place]   At capacity:     Doylene Canning, [place] [facility] Coordinator [phone]

## 2021-10-25 ENCOUNTER — Ambulatory Visit (INDEPENDENT_AMBULATORY_CARE_PROVIDER_SITE_OTHER)
Admission: EM | Admit: 2021-10-25 | Discharge: 2021-10-25 | Disposition: A | Discharge status: Home / Self Care | Payer: Medicare Other | Source: Home / Self Care

## 2021-10-25 DIAGNOSIS — F321 Major depressive disorder, single episode, moderate: Secondary | ICD-10-CM | POA: Diagnosis not present

## 2021-10-25 DIAGNOSIS — F603 Borderline personality disorder: Secondary | ICD-10-CM | POA: Diagnosis not present

## 2021-10-25 DIAGNOSIS — Z5900 Homelessness unspecified: Secondary | ICD-10-CM | POA: Insufficient documentation

## 2021-10-25 DIAGNOSIS — F332 Major depressive disorder, recurrent severe without psychotic features: Secondary | ICD-10-CM

## 2021-10-25 DIAGNOSIS — F411 Generalized anxiety disorder: Secondary | ICD-10-CM

## 2021-10-25 DIAGNOSIS — R451 Restlessness and agitation: Secondary | ICD-10-CM

## 2021-10-25 DIAGNOSIS — F1410 Cocaine abuse, uncomplicated: Secondary | ICD-10-CM

## 2021-10-25 DIAGNOSIS — F1420 Cocaine dependence, uncomplicated: Secondary | ICD-10-CM

## 2021-10-25 MED ORDER — OLANZAPINE 2.5 MG PO TABS
2.5000 mg | ORAL_TABLET | Freq: Every day | ORAL | 0 refills | Status: AC
Start: 2021-10-25 — End: ?

## 2021-10-25 MED ORDER — PRAZOSIN HCL 1 MG PO CAPS
1.0000 mg | ORAL_CAPSULE | Freq: Every day | ORAL | 0 refills | Status: AC
Start: 2021-10-25 — End: ?

## 2021-10-25 MED ORDER — PRAZOSIN HCL 1 MG PO CAPS
1.0000 mg | ORAL_CAPSULE | Freq: Every day | ORAL | 0 refills | Status: DC
Start: 2021-10-25 — End: 2021-10-25

## 2021-10-25 MED ORDER — OLANZAPINE 2.5 MG PO TABS: 2.5000 mg | ORAL_TABLET | Freq: Every day | ORAL | 0 refills | Status: DC

## 2021-10-25 MED ORDER — DOXEPIN HCL 25 MG PO CAPS: 25.0000 mg | ORAL_CAPSULE | Freq: Every evening | ORAL | 0 refills | Status: DC | PRN

## 2021-10-25 MED ORDER — DOXEPIN HCL 25 MG PO CAPS: 25.0000 mg | ORAL_CAPSULE | Freq: Every evening | ORAL | 0 refills | Status: AC | PRN

## 2021-10-25 NOTE — Discharge Instructions (Addendum)

## 2021-10-25 NOTE — Progress Notes (Signed)
   10/25/21 2101  Patient Reported Information  How Did You Hear About [place]? Self  What Is the Reason for Your Visit/Call Today? Pt reports, she's in crisis, anxious, dually diagnosed, substance use issues. Pt reports, she's been homeless for 3.5 months and is trying to figure out where she can be sent. Pt reports, she wants be sent to [facility] in [facility] but knows they don't accept Medicare. Pt reports, [facility] provides housing and partial hospitalization. Pt reports, she's trying to get off the street to move forward. Per pt, she wants to go back to [facility], as they have more resources. Per chart, today, pt was discharged from [facility] with the recommendation of following up with outpatient resources. Pt denies, SI, HI, AVH, self-injurious behaviors and access to weapons.  How Long Has This Been Causing You Problems? 1-6 months  What Do You Feel Would Help You the Most Today? Housing Assistance;Alcohol or Drug Use Treatment  Have You Recently Had Any Thoughts About Hurting Yourself? No  Are You Planning to Commit Suicide/Harm Yourself At This time? No  Have you Recently Had Thoughts About Hurting Someone [name]? No  Are You Planning To Harm Someone At This Time? No  Have You Used Any Alcohol or Drugs in the Past 24 Hours? Yes  What Did You Use and How Much? Pt reports, using Cocaine about five days ago.  Do You Currently Have a Therapist/Psychiatrist? No  [facility] Screening Triage Referral Assessment  Type of Contact Face-to-Face  Is this Initial or Reassessment? Initial Assessment  Location of Assessment [facility] Services  Provider location [facility] Assessment Services  Collateral Involvement Pt denies, having supports, her mother wrote her off. Per pt, she called her mother on Sunday while at [facility] and she ended the call, this has been going on for 10 years.  Is CPS involved or ever been involved? Never  Is APS involved or ever been involved? Never  Patient  Determined To Be At Risk for Harm To Self or Others Based on Review of Patient Reported Information or Presenting Complaint? No  Does Patient Present under Involuntary Commitment? No  [place] of Residence [place] (Pt reports, she's from [place].)  Patient Currently Receiving the Following Services: Not Receiving Services  Determination of Need Routine (7 days)  Options For Referral Medication Management;BH Urgent Care;Inpatient Hospitalization;Facility-Based Crisis;Outpatient Therapy    Determination of need: Routine.    Redmond Pulling, MS, [facility], [facility] Triage Specialist [phone]
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
## 2021-10-25 NOTE — ED Notes (Signed)
Pt became anxious and agitated. Sts we have done nothing for her and that she does not want to go to [facility]. Pt sts she has been up all night peeing - used the restroom 4-5 times last nigh and slept through the night otherwise. Ivc and situation explained to pt ad nauseam. Pt eventually shouted at me to leave room.

## 2021-10-25 NOTE — BH Assessment (Signed)
BHH Assessment Progress Note   Per Nira Conn, NP, this involuntary pt continues to require psychiatric hospitalization at this time.  Pt was accepted to [facility] overnight, but a planned discharge on their unit failed to materialize and they had to rescind the bed offer.  This writer has follow up with them today and the status has not changed.  The following facilities have been contacted to seek placement for this pt, with results as noted:  Beds available, information sent, decision pending: [facility] (unlikely to be able to accommodate) [facility] (re-referral) [facility] (re-referral) [facility] (re-referral)  At capacity:     Doylene Canning, MA Behavioral Health Coordinator [phone]

## 2021-10-25 NOTE — ED Notes (Signed)
Report attempted via phone call with request to call back in a moment.

## 2021-10-25 NOTE — Discharge Instructions (Addendum)
It was our pleasure to provide your ER care today - we hope that you feel better.  Avoid drug use, as it is harmful to your physical health and mental well-being. See resource guide provided for outpatient substance use treatment and behavioral health follow up options.   Follow up closely with primary care doctor and behavioral health provider. For mental health issues and/or crisis, you may also go directly to the Behavioral Health Urgent Care Center - it is open 24/7 and walk-in visits are welcome.   Return to ER if worse, new symptoms, fevers, chest pain, trouble breathing, or other concern.     For your behavioral health needs you are advised to follow up with one of the providers listed below at your earliest opportunity:       [facility]      [address]      [place], [place] [address]      [phone]       New patients are seen at their walk-in clinic.  Walk-in hours are Monday - Friday from 8:30 am - 12:00 pm, and from 1:00 pm - 2:30 pm.  Walk-in patients are seen on a first come, first served basis, so try to arrive as early as possible for the best chance of being seen the same day.       [address]      [address]      [place], [place] [address]      [phone]       [facility]      [address] [place], [place] [address]      [phone]   To help you maintain a sober lifestyle, a substance abuse treatment program may be beneficial to you.  Contact one of the following providers at your earliest opportunity to ask about enrolling their program:       [facility]      [address]      [place], [place] [address]      [phone]       [facility]      [address]      [place], [place] [address]      [phone]   For your shelter needs, contact the following service providers:       [facility]      [address] [place], [place]  [address]      [phone]       [facility]      [address]      [place], [place] [address]      [phone]   For day shelter and other supportive services for the homeless, contact the [facility]):       [facility]      [address]      [place], [place] [address]      [phone]  For transitional housing, Pension scheme manager.  They provide longer term housing than a shelter, but there is an application process:       [facility] [place]      [address], [place] [address]      [phone]

## 2021-10-25 NOTE — BH Assessment (Signed)
BHH Assessment Progress Note   Per Nira Conn, NP, this pt does not require psychiatric hospitalization at this time.  Pt presents under IVC initiated by EDP Gerhard Munch, MD which has been rescinded by [facility].  Pt is psychiatrically cleared.  Discharge instructions include referral information for several area outpatient providers.  Also included is information for treatment for substance use disorders, as well as area supportive services for the homeless.  EDP Cathren Laine, MD and pt's nurse, Duwayne Heck, have been notified.  Doylene Canning, MA Triage Specialist [phone]

## 2021-10-25 NOTE — ED Provider Notes (Signed)
Behavioral Health Urgent Care Medical Screening Exam  Patient Name: Anna [name] MRN: [id number] Date of Evaluation: 10/26/21 Chief Complaint:  I need somewhere to stay Diagnosis:  Final diagnoses:  Borderline personality disorder (HCC)  Current moderate episode of major depressive disorder without prior episode (HCC)  Cocaine use disorder (HCC)    History of Present illness: Anna [name] is a 51 y.o. female with a history of Borderline Personality Disorder, MDD, GAD and cocaine use disorder presenting voluntarily to [facility]. Patient reports cocaine use about 5 days ago. Patient reports that she is homeless and she does not have any place to go. Patient reports that she wants help with housing and she wants to go back to [facility] where she was living previously. Patient reports that she was staying in [place] with a female friend and the relationship did not work out and she need somewhere to stay until she can get back to [place] because she want to get away from drugs. Patient all of the shelters are full and that is why she came to [facility].  Patient denies any SI/HI or AVH.   Patient was previously admitted to [facility] ED for delusions and was [facility] by EDP. Patient was evaluated by NP Allyson Sabal and no longer displayed any delusional thought process and was discharged from [facility] on 10/25/21 with community resources.  Patient is alert oriented x4 and does not seem to be responding to any internal or external stimuli at this time. NP explained to patient that she does not meet criteria for admission patient became upset and started yelling and cursing at this provider and had to be escorted out by security.     Psychiatric Specialty Exam  Presentation  General Appearance:Disheveled  Eye Contact:Good  Speech:Clear and Coherent  Speech Volume:Normal  Handedness:Right   Mood and Affect  Mood:Anxious  Affect:Congruent   Thought Process  Thought  Processes:Coherent  Descriptions of Associations:Intact  Orientation:Full (Time, Place and Person)  Thought Content:WDL  Diagnosis of Schizophrenia or Schizoaffective disorder in past: No  Duration of Psychotic Symptoms: Greater than six months  Hallucinations:None voices, continual loop telling her she is no good sees shadows and things moving  Ideas of Reference:None  Suicidal Thoughts:No Without Intent; Without Plan  Homicidal Thoughts:No Without Intent; Without Means to Carry Out   Sensorium  Memory:Immediate Good; Recent Good; Remote Good  Judgment:Fair  Insight:Fair   Executive Functions  Concentration:Fair  Attention Span:Fair  Recall:Good  Fund of Knowledge:Good  Language:Good   Psychomotor Activity  Psychomotor Activity:Normal   Assets  Assets:Social Support; Resilience; Communication Skills   Sleep  Sleep:Fair  Number of hours: -1   No data recorded  Physical Exam: Physical Exam HENT:     Head: Normocephalic.     Nose: Nose normal.  Eyes:     Pupils: Pupils are equal, round, and reactive to light.  Cardiovascular:     Rate and Rhythm: Normal rate.  Pulmonary:     Effort: Pulmonary effort is normal.  Abdominal:     General: Abdomen is flat.  Musculoskeletal:        General: Normal range of motion.     Cervical back: Normal range of motion.  Skin:    General: Skin is warm.  Neurological:     Mental Status: She is alert and oriented to person, place, and time.  Psychiatric:        Speech: Speech normal.        Behavior: Behavior is agitated.  Thought Content: Thought content normal.        Cognition and Memory: Cognition normal.        Judgment: Judgment normal.    Review of Systems  Constitutional: Negative.   HENT: Negative.    Eyes: Negative.   Respiratory: Negative.    Cardiovascular: Negative.   Gastrointestinal: Negative.   Genitourinary: Negative.   Musculoskeletal: Negative.   Skin: Negative.    Neurological: Negative.   Endo/Heme/Allergies: Negative.   Psychiatric/Behavioral:  Positive for depression and substance abuse. Negative for hallucinations and suicidal ideas.    Blood pressure (!) 140/101, pulse 89, temperature 98.2 F (36.8 C), temperature source Oral, resp. rate 18, SpO2 98 %. There is no height or weight on file to calculate BMI.  Musculoskeletal: Strength & Muscle Tone: within normal limits Gait & Station: normal Patient leans: N/A   [facility] MSE Discharge Disposition for Follow up and Recommendations: Based on my evaluation the patient does not appear to have an emergency medical condition and can be discharged with resources and follow up care in outpatient services for Medication Management and Individual Therapy   Jasper Riling, NP 10/26/2021, 6:45 AM

## 2021-10-25 NOTE — Progress Notes (Signed)
[facility] uploaded resources into AVS upon discharge  Nancee Liter [facility]

## 2021-10-25 NOTE — ED Notes (Signed)
Facility returned call back stating the acute bed that pt requires is not available on there end due to discharge falling through.

## 2021-10-25 NOTE — ED Provider Notes (Addendum)
Emergency Medicine Observation Re-evaluation Note  Anna [name] is a 51 y.o. female, seen on rounds today.  Pt initially presented to the ED for complaints of  psychiatric evaluation. Pt currently calm, cooperative, nad. Pt has been accepted at [facility].  Physical Exam  BP 118/82 (BP Location: Left Arm)   Pulse 76   Temp 97.7 F (36.5 C) (Oral)   Resp 18   SpO2 97%  Physical Exam General: calm. Cardiac: regular rate Lungs: breathing comfortably Psych: calm. Normal mood. No SI.   ED Course / MDM    I have reviewed the labs performed to date as well as medications administered while in observation.  Recent changes in the last 24 hours include ED obs, reassessment, BH consult.   Plan   Anna [name] is under involuntary commitment.   Pt accepted to [facility], Dr Noelle Penner.   Pt appears stable for transport/transfer.    Cathren Laine, MD 10/25/21 0745  [facility] rescinded bed offer (stating a d/c did not happen). BH team alerted, and will f/u with [facility], [facility], [facility],  and find placement for patient.       Cathren Laine, MD 10/25/21 [phone]  [facility] team has re-evaluated pt in ED. They indicate no acute psychosis and pt with no thoughts of harm to self or others - they have provided outpatient resources and indicate pt clear for d/c.   Pt indicates feels ready for d/c.   Return precautions provided.       Cathren Laine, MD 10/25/21 1250

## 2023-07-08 IMAGING — DX DG CHEST 1V PORT
1 series · 1 of 1 positions shown · non-contrast
Comparison: Chest x-ray 06/25/2019.

CLINICAL DATA: 50-year-old female with history of chest pain.
Anxiety.

EXAM:
PORTABLE CHEST 1 VIEW

[chest ap]
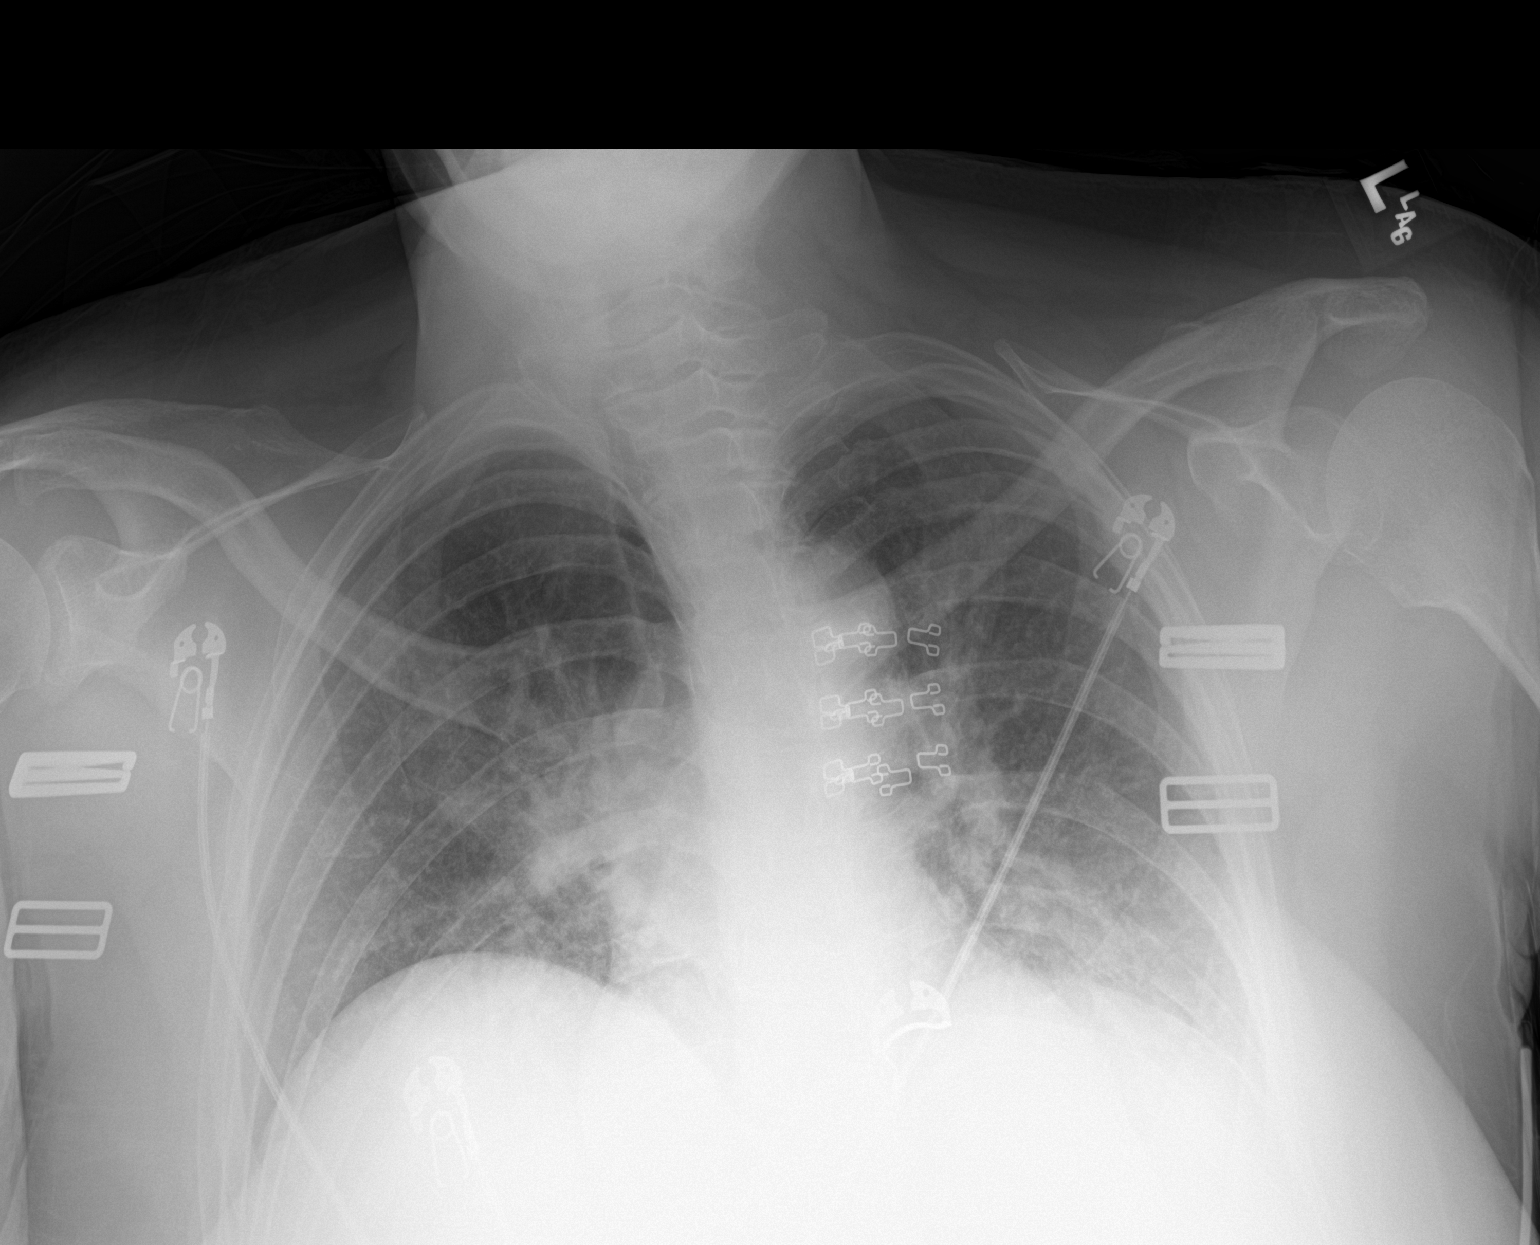

[1 of 1 positions shown; findings below may reference images not displayed]

FINDINGS: Lung volumes are very low. Diffuse peribronchial cuffing and
scattered areas of interstitial prominence. Ill-defined opacity in
the right hilar and suprahilar region. No pneumothorax. No evidence
of pulmonary edema. Heart size is normal. Mediastinal contours are
distorted by low lung volumes and patient rotation to the right.
IMPRESSION: 1. The appearance the chest is concerning for bronchitis and
potential developing right upper lobe bronchopneumonia. Followup PA
and lateral chest X-ray is recommended in 3-4 weeks following trial
of antibiotic therapy to ensure resolution and exclude underlying
malignancy.

## 2023-07-08 IMAGING — CT CT HEAD W/O CM
3 series · 16 of 47 positions shown, 19 images · non-contrast
Comparison: None.

CLINICAL DATA: Medical clearance

EXAM:
CT HEAD WITHOUT CONTRAST
TECHNIQUE: Contiguous axial images were obtained from the base of the skull
through the vertex without intravenous contrast.

[Series 2: head wo · axial · 0.43mm/px · z∈[-121,+4]mm · 10 of 31 slices shown, 13 images]
[im 3/31  brain]
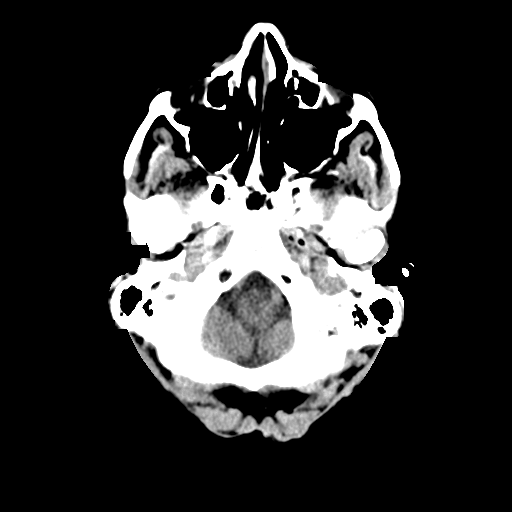
[im 3/31  bone]
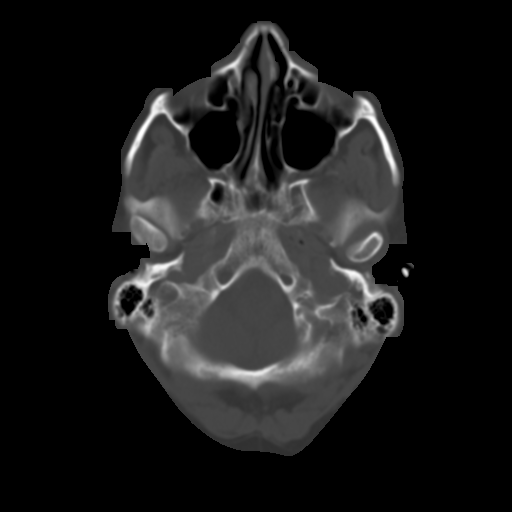
[im 6/31  brain]
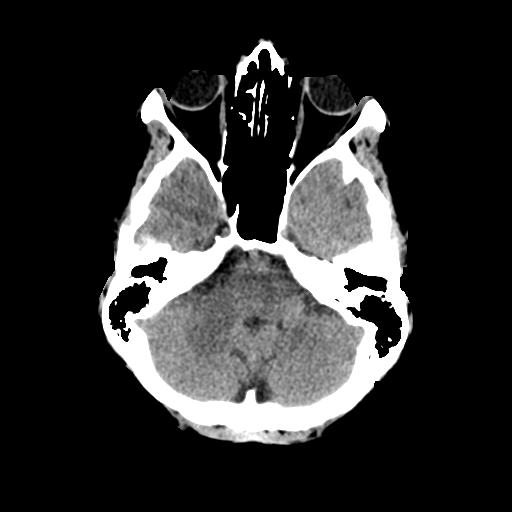
[im 9/31  brain]
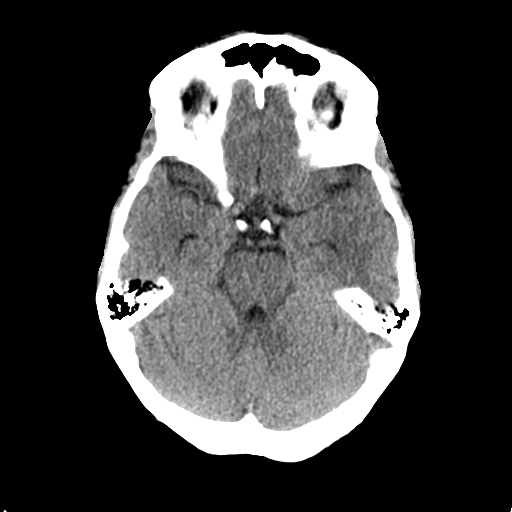
[im 11/31  brain]
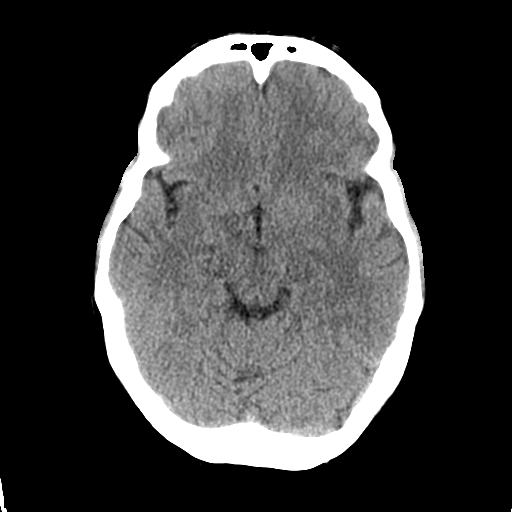
[im 14/31  brain]
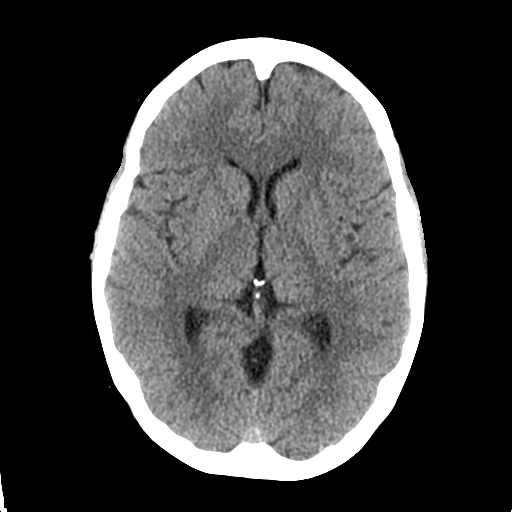
[im 14/31  bone]
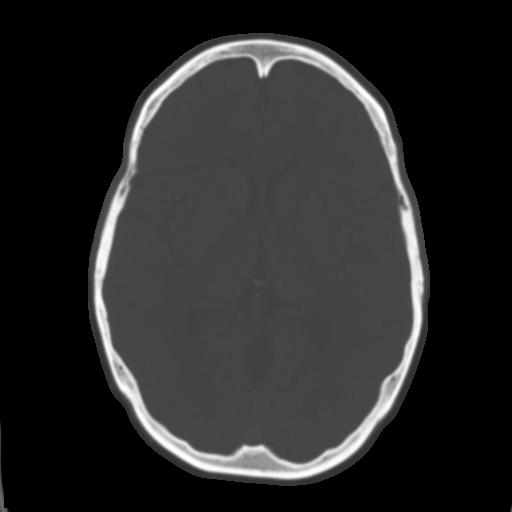
[im 17/31  brain]
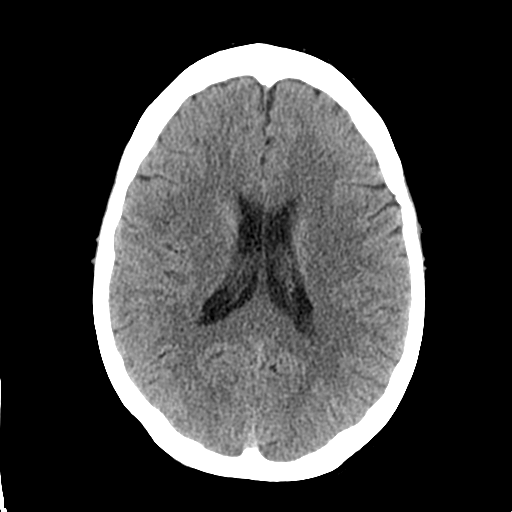
[im 20/31  brain]
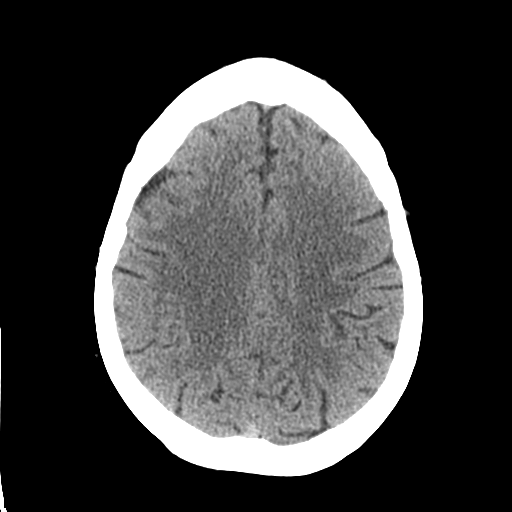
[im 23/31  brain]
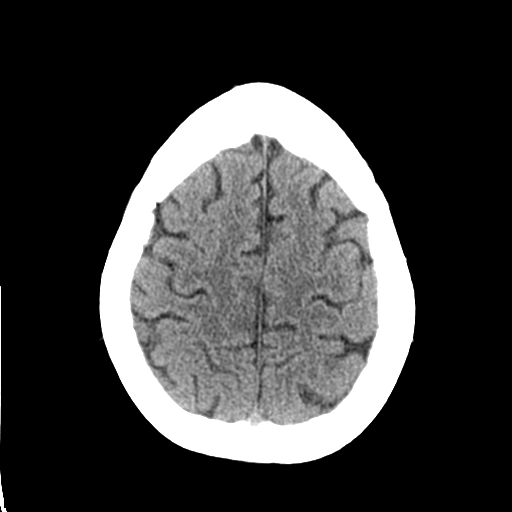
[im 25/31  brain]
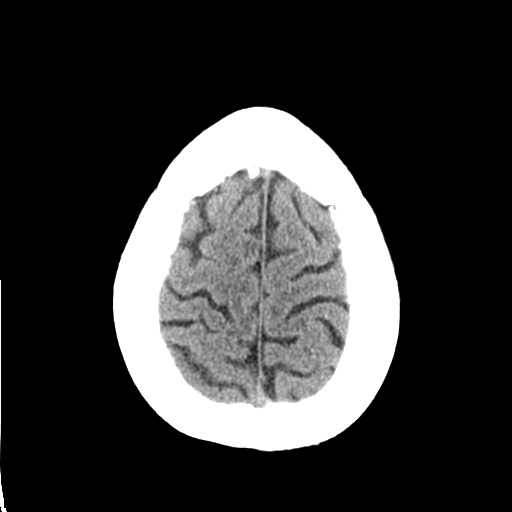
[im 25/31  bone]
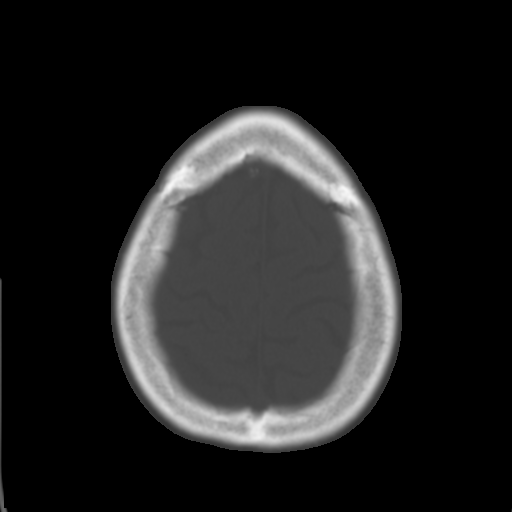
[im 28/31  brain]
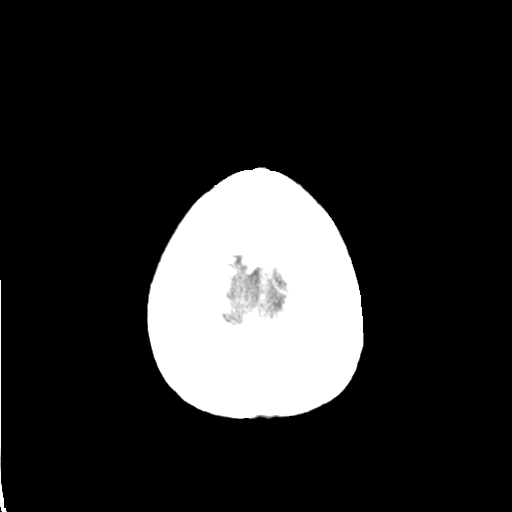

[Series 5: coronal soft tissue · coronal · 0.30mm/px · 3 of 68 slices shown]
[im 23/68  brain]
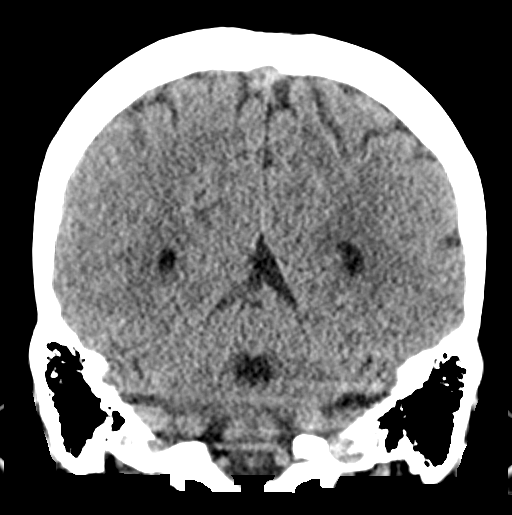
[im 30/68  brain]
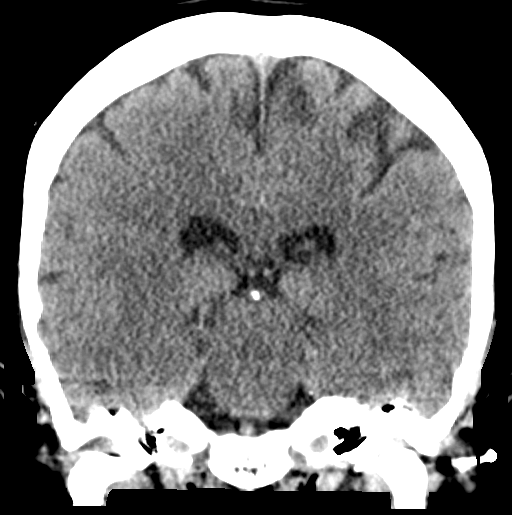
[im 38/68  brain]
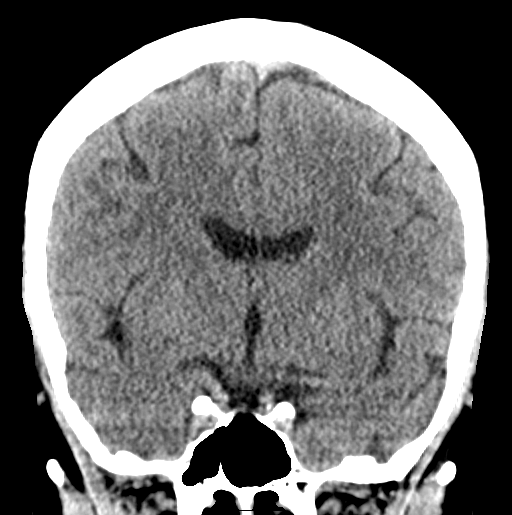

[Series 6: sagittal soft tissue · sagittal · 0.30mm/px · 3 of 52 slices shown]
[im 18/52  brain]
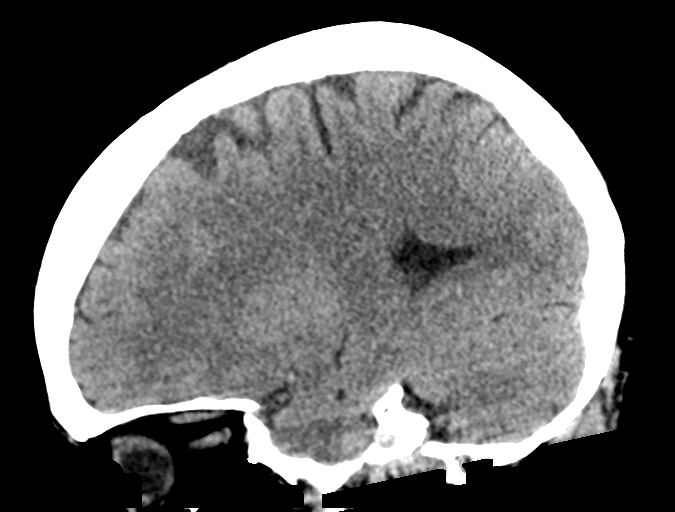
[im 26/52  brain]
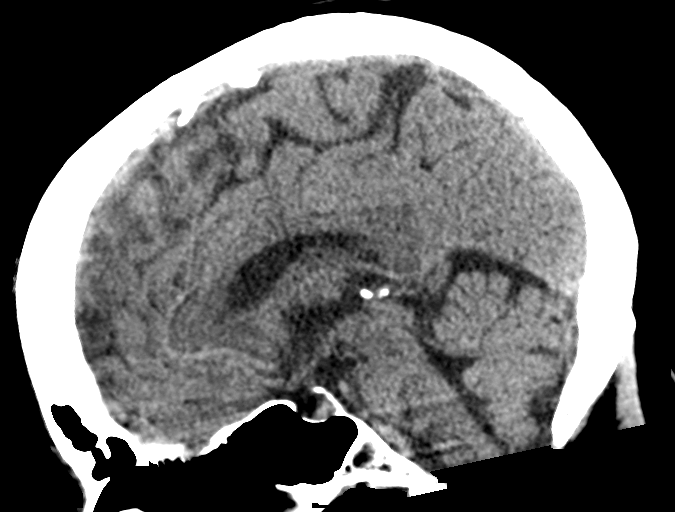
[im 35/52  brain]
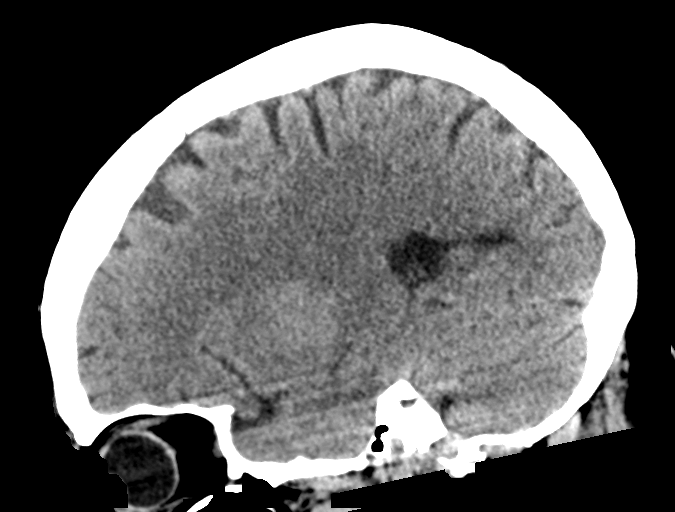

[16 of 47 positions shown; findings below may reference images not displayed]

FINDINGS: Brain: No evidence of acute infarction, hemorrhage, cerebral edema,
mass, mass effect, or midline shift. Ventricles and sulci are within
normal limits for age. No extra-axial fluid collection.

Vascular: No hyperdense vessel or unexpected calcification.

Skull: Normal. Negative for fracture or focal lesion.

Sinuses/Orbits: No acute finding.

Other: The mastoid air cells are well aerated.
IMPRESSION: No acute intracranial process.

## 2023-10-07 ENCOUNTER — Emergency Department (HOSPITAL_COMMUNITY)
Admission: EM | Admit: 2023-10-07 | Discharge: 2023-10-08 | Discharge status: Against Medical Advice | Attending: Emergency Medicine | Admitting: Emergency Medicine

## 2023-10-07 ENCOUNTER — Encounter (HOSPITAL_COMMUNITY): Payer: Self-pay | Admitting: Emergency Medicine

## 2023-10-07 DIAGNOSIS — F1410 Cocaine abuse, uncomplicated: Secondary | ICD-10-CM | POA: Diagnosis not present

## 2023-10-07 DIAGNOSIS — F1510 Other stimulant abuse, uncomplicated: Secondary | ICD-10-CM | POA: Insufficient documentation

## 2023-10-07 DIAGNOSIS — Z79899 Other long term (current) drug therapy: Secondary | ICD-10-CM | POA: Insufficient documentation

## 2023-10-07 DIAGNOSIS — F1210 Cannabis abuse, uncomplicated: Secondary | ICD-10-CM | POA: Diagnosis not present

## 2023-10-07 DIAGNOSIS — Y900 Blood alcohol level of less than 20 mg/100 ml: Secondary | ICD-10-CM | POA: Diagnosis not present

## 2023-10-07 DIAGNOSIS — R451 Restlessness and agitation: Secondary | ICD-10-CM | POA: Diagnosis present

## 2023-10-07 DIAGNOSIS — Z5329 Procedure and treatment not carried out because of patient's decision for other reasons: Secondary | ICD-10-CM | POA: Insufficient documentation

## 2023-10-07 LAB — CBC
HCT: 42.8 % (ref 36.0–46.0)
Hemoglobin: 13.5 g/dL (ref 12.0–15.0)
MCH: 28.8 pg (ref 26.0–34.0)
MCHC: 31.5 g/dL (ref 30.0–36.0)
MCV: 91.3 fL (ref 80.0–100.0)
Platelets: 268 10*3/uL (ref 150–400)
RBC: 4.69 MIL/uL (ref 3.87–5.11)
RDW: 13.1 % (ref 11.5–15.5)
WBC: 7.2 10*3/uL (ref 4.0–10.5)
nRBC: 0 % (ref 0.0–0.2)

## 2023-10-07 LAB — COMPREHENSIVE METABOLIC PANEL WITH GFR
ALT: 22 U/L (ref 0–44)
AST: 24 U/L (ref 15–41)
Albumin: 3.6 g/dL (ref 3.5–5.0)
Alkaline Phosphatase: 83 U/L (ref 38–126)
Anion gap: 8 (ref 5–15)
BUN: 14 mg/dL (ref 6–20)
CO2: 25 mmol/L (ref 22–32)
Calcium: 9 mg/dL (ref 8.9–10.3)
Chloride: 106 mmol/L (ref 98–111)
Creatinine, Ser: 1.06 mg/dL — ABNORMAL HIGH (ref 0.44–1.00)
GFR, Estimated: >60 mL/min (ref >60)
Glucose, Bld: 101 mg/dL — ABNORMAL HIGH (ref 70–99)
Potassium: 3.4 mmol/L — ABNORMAL LOW (ref 3.5–5.1)
Sodium: 139 mmol/L (ref 135–145)
Total Bilirubin: 0.6 mg/dL (ref 0.0–1.2)
Total Protein: 6.2 g/dL — ABNORMAL LOW (ref 6.5–8.1)

## 2023-10-07 LAB — ETHANOL: Alcohol, Ethyl (B): <15 mg/dL (ref <15)
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
## 2023-10-07 NOTE — ED Triage Notes (Signed)
 Pt came out of room and refused to enter room where it was beeping. Informed she can return to waiting room bc we have to get her vitals and cannot turn off machines.

## 2023-10-08 LAB — RAPID URINE DRUG SCREEN, HOSP PERFORMED
Amphetamines: POSITIVE — AB
Barbiturates: NOT DETECTED
Benzodiazepines: NOT DETECTED
Cocaine: POSITIVE — AB
Opiates: NOT DETECTED
Tetrahydrocannabinol: POSITIVE — AB
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
## 2023-10-08 NOTE — ED Notes (Signed)
 Pt yelling at staff and other pts.  She was asked to stop yelling however she just became louder.   Pt ran at staff however stopped when staff member turned around.  She continued to yell at staff now wanting to leave.  Provider bedside and agreed that pt could leave.  Pt given belongings and escorted out by security.
